# Patient Record
Sex: Male | Born: 1935 | Race: White | Hispanic: No | Marital: Married | Smoking: Never smoker
Health system: Southern US, Community
[De-identification: ages and names within clinical notes are randomized; demographics above are authoritative.]

## PROBLEM LIST (undated history)

## (undated) DIAGNOSIS — G894 Chronic pain syndrome: Secondary | ICD-10-CM

## (undated) DIAGNOSIS — K5790 Diverticulosis of intestine, part unspecified, without perforation or abscess without bleeding: Secondary | ICD-10-CM

## (undated) DIAGNOSIS — N312 Flaccid neuropathic bladder, not elsewhere classified: Secondary | ICD-10-CM

## (undated) DIAGNOSIS — F0390 Unspecified dementia without behavioral disturbance: Secondary | ICD-10-CM

## (undated) DIAGNOSIS — F32A Depression, unspecified: Secondary | ICD-10-CM

## (undated) DIAGNOSIS — K635 Polyp of colon: Secondary | ICD-10-CM

## (undated) DIAGNOSIS — C61 Malignant neoplasm of prostate: Secondary | ICD-10-CM

## (undated) DIAGNOSIS — I10 Essential (primary) hypertension: Secondary | ICD-10-CM

## (undated) DIAGNOSIS — N400 Enlarged prostate without lower urinary tract symptoms: Secondary | ICD-10-CM

## (undated) DIAGNOSIS — J449 Chronic obstructive pulmonary disease, unspecified: Secondary | ICD-10-CM

## (undated) DIAGNOSIS — I2510 Atherosclerotic heart disease of native coronary artery without angina pectoris: Secondary | ICD-10-CM

## (undated) DIAGNOSIS — E785 Hyperlipidemia, unspecified: Secondary | ICD-10-CM

## (undated) DIAGNOSIS — I4891 Unspecified atrial fibrillation: Secondary | ICD-10-CM

## (undated) HISTORY — PX: BACK SURGERY: SHX140

## (undated) HISTORY — DX: Chronic pain syndrome: G89.4

## (undated) HISTORY — DX: Chronic obstructive pulmonary disease, unspecified: J44.9

## (undated) HISTORY — DX: Malignant neoplasm of prostate: C61

## (undated) HISTORY — DX: Polyp of colon: K63.5

## (undated) HISTORY — DX: Hyperlipidemia, unspecified: E78.5

## (undated) HISTORY — PX: EYE SURGERY: SHX253

## (undated) HISTORY — DX: Flaccid neuropathic bladder, not elsewhere classified: N31.2

## (undated) HISTORY — PX: CARDIAC SURGERY: SHX584

## (undated) HISTORY — DX: Benign prostatic hyperplasia without lower urinary tract symptoms: N40.0

## (undated) HISTORY — PX: TRANSURETHRAL RESECTION OF PROSTATE: SHX73

## (undated) HISTORY — DX: Unspecified dementia, unspecified severity, without behavioral disturbance, psychotic disturbance, mood disturbance, and anxiety: F03.90

## (undated) HISTORY — DX: Depression, unspecified: F32.A

## (undated) HISTORY — PX: CORONARY ARTERY BYPASS GRAFT: SHX141

## (undated) HISTORY — DX: Diverticulosis of intestine, part unspecified, without perforation or abscess without bleeding: K57.90

## (undated) HISTORY — DX: Unspecified atrial fibrillation: I48.91

---

## 2002-08-23 ENCOUNTER — Encounter: Admission: RE | Admit: 2002-08-23 | Discharge: 2002-08-23 | Payer: Self-pay | Admitting: Urology

## 2002-08-23 ENCOUNTER — Encounter: Payer: Self-pay | Admitting: Urology

## 2002-08-25 ENCOUNTER — Ambulatory Visit (HOSPITAL_BASED_OUTPATIENT_CLINIC_OR_DEPARTMENT_OTHER): Admission: RE | Admit: 2002-08-25 | Discharge: 2002-08-25 | Payer: Self-pay | Admitting: Urology

## 2002-09-02 ENCOUNTER — Ambulatory Visit: Admission: RE | Admit: 2002-09-02 | Discharge: 2002-09-27 | Payer: Self-pay | Admitting: Radiation Oncology

## 2002-09-07 ENCOUNTER — Encounter: Admission: RE | Admit: 2002-09-07 | Discharge: 2002-09-07 | Payer: Self-pay | Admitting: Urology

## 2002-09-07 ENCOUNTER — Encounter: Payer: Self-pay | Admitting: Urology

## 2002-09-09 ENCOUNTER — Encounter: Payer: Self-pay | Admitting: Urology

## 2002-09-09 ENCOUNTER — Encounter: Admission: RE | Admit: 2002-09-09 | Discharge: 2002-09-09 | Payer: Self-pay | Admitting: Urology

## 2002-10-13 ENCOUNTER — Ambulatory Visit: Admission: RE | Admit: 2002-10-13 | Discharge: 2003-01-11 | Payer: Self-pay | Admitting: Radiation Oncology

## 2003-01-06 ENCOUNTER — Ambulatory Visit (HOSPITAL_BASED_OUTPATIENT_CLINIC_OR_DEPARTMENT_OTHER): Admission: RE | Admit: 2003-01-06 | Discharge: 2003-01-06 | Payer: Self-pay | Admitting: Urology

## 2003-01-06 ENCOUNTER — Encounter: Payer: Self-pay | Admitting: Urology

## 2003-01-30 ENCOUNTER — Ambulatory Visit: Admission: RE | Admit: 2003-01-30 | Discharge: 2003-02-22 | Payer: Self-pay | Admitting: Radiation Oncology

## 2013-01-15 DIAGNOSIS — I10 Essential (primary) hypertension: Secondary | ICD-10-CM | POA: Insufficient documentation

## 2013-01-15 DIAGNOSIS — I2510 Atherosclerotic heart disease of native coronary artery without angina pectoris: Secondary | ICD-10-CM | POA: Insufficient documentation

## 2013-01-15 DIAGNOSIS — J45909 Unspecified asthma, uncomplicated: Secondary | ICD-10-CM | POA: Insufficient documentation

## 2015-08-22 DIAGNOSIS — E611 Iron deficiency: Secondary | ICD-10-CM | POA: Insufficient documentation

## 2015-08-22 DIAGNOSIS — K5730 Diverticulosis of large intestine without perforation or abscess without bleeding: Secondary | ICD-10-CM | POA: Insufficient documentation

## 2015-08-22 DIAGNOSIS — K219 Gastro-esophageal reflux disease without esophagitis: Secondary | ICD-10-CM | POA: Insufficient documentation

## 2015-08-22 DIAGNOSIS — R413 Other amnesia: Secondary | ICD-10-CM | POA: Insufficient documentation

## 2016-06-02 DIAGNOSIS — D649 Anemia, unspecified: Secondary | ICD-10-CM | POA: Insufficient documentation

## 2016-06-10 DIAGNOSIS — Z8546 Personal history of malignant neoplasm of prostate: Secondary | ICD-10-CM | POA: Insufficient documentation

## 2016-06-17 DIAGNOSIS — C61 Malignant neoplasm of prostate: Secondary | ICD-10-CM | POA: Insufficient documentation

## 2016-12-30 ENCOUNTER — Emergency Department (HOSPITAL_BASED_OUTPATIENT_CLINIC_OR_DEPARTMENT_OTHER)
Admission: EM | Admit: 2016-12-30 | Discharge: 2016-12-30 | Disposition: A | Discharge status: Home / Self Care | Payer: Medicare Other | Attending: Emergency Medicine | Admitting: Emergency Medicine

## 2016-12-30 ENCOUNTER — Encounter (HOSPITAL_BASED_OUTPATIENT_CLINIC_OR_DEPARTMENT_OTHER): Payer: Self-pay | Admitting: Emergency Medicine

## 2016-12-30 ENCOUNTER — Emergency Department (HOSPITAL_BASED_OUTPATIENT_CLINIC_OR_DEPARTMENT_OTHER): Payer: Medicare Other

## 2016-12-30 DIAGNOSIS — Z7901 Long term (current) use of anticoagulants: Secondary | ICD-10-CM | POA: Insufficient documentation

## 2016-12-30 DIAGNOSIS — S8012XA Contusion of left lower leg, initial encounter: Secondary | ICD-10-CM | POA: Diagnosis not present

## 2016-12-30 DIAGNOSIS — S8992XA Unspecified injury of left lower leg, initial encounter: Secondary | ICD-10-CM | POA: Diagnosis present

## 2016-12-30 DIAGNOSIS — Y929 Unspecified place or not applicable: Secondary | ICD-10-CM | POA: Diagnosis not present

## 2016-12-30 DIAGNOSIS — Y999 Unspecified external cause status: Secondary | ICD-10-CM | POA: Diagnosis not present

## 2016-12-30 DIAGNOSIS — I2510 Atherosclerotic heart disease of native coronary artery without angina pectoris: Secondary | ICD-10-CM | POA: Insufficient documentation

## 2016-12-30 DIAGNOSIS — Z7982 Long term (current) use of aspirin: Secondary | ICD-10-CM | POA: Insufficient documentation

## 2016-12-30 DIAGNOSIS — X58XXXA Exposure to other specified factors, initial encounter: Secondary | ICD-10-CM | POA: Diagnosis not present

## 2016-12-30 DIAGNOSIS — Y939 Activity, unspecified: Secondary | ICD-10-CM | POA: Insufficient documentation

## 2016-12-30 DIAGNOSIS — Z79899 Other long term (current) drug therapy: Secondary | ICD-10-CM | POA: Diagnosis not present

## 2016-12-30 DIAGNOSIS — R791 Abnormal coagulation profile: Secondary | ICD-10-CM | POA: Diagnosis not present

## 2016-12-30 DIAGNOSIS — I10 Essential (primary) hypertension: Secondary | ICD-10-CM | POA: Insufficient documentation

## 2016-12-30 HISTORY — DX: Essential (primary) hypertension: I10

## 2016-12-30 HISTORY — DX: Atherosclerotic heart disease of native coronary artery without angina pectoris: I25.10

## 2016-12-30 LAB — CBC WITH DIFFERENTIAL/PLATELET
BASOS ABS: 0 10*3/uL (ref 0.0–0.1)
BASOS PCT: 0 %
EOS ABS: 0 10*3/uL (ref 0.0–0.7)
Eosinophils Relative: 0 %
HCT: 35 % — ABNORMAL LOW (ref 39.0–52.0)
Hemoglobin: 12.1 g/dL — ABNORMAL LOW (ref 13.0–17.0)
LYMPHS ABS: 0.5 10*3/uL — AB (ref 0.7–4.0)
Lymphocytes Relative: 3 %
MCH: 29.8 pg (ref 26.0–34.0)
MCHC: 34.6 g/dL (ref 30.0–36.0)
MCV: 86.2 fL (ref 78.0–100.0)
Monocytes Absolute: 1.2 10*3/uL — ABNORMAL HIGH (ref 0.1–1.0)
Monocytes Relative: 8 %
Neutro Abs: 13.6 10*3/uL — ABNORMAL HIGH (ref 1.7–7.7)
Neutrophils Relative %: 89 %
Platelets: 263 10*3/uL (ref 150–400)
RBC: 4.06 MIL/uL — AB (ref 4.22–5.81)
RDW: 15.8 % — ABNORMAL HIGH (ref 11.5–15.5)
WBC: 15.3 10*3/uL — AB (ref 4.0–10.5)

## 2016-12-30 LAB — COMPREHENSIVE METABOLIC PANEL
ALBUMIN: 3.9 g/dL (ref 3.5–5.0)
ALK PHOS: 93 U/L (ref 38–126)
ALT: 30 U/L (ref 17–63)
AST: 30 U/L (ref 15–41)
Anion gap: 9 (ref 5–15)
BUN: 25 mg/dL — ABNORMAL HIGH (ref 6–20)
CALCIUM: 9.3 mg/dL (ref 8.9–10.3)
CHLORIDE: 96 mmol/L — AB (ref 101–111)
CO2: 27 mmol/L (ref 22–32)
Creatinine, Ser: 0.62 mg/dL (ref 0.61–1.24)
GFR calc Af Amer: >60 mL/min (ref >60)
GFR calc non Af Amer: >60 mL/min (ref >60)
GLUCOSE: 117 mg/dL — AB (ref 65–99)
Potassium: 3.7 mmol/L (ref 3.5–5.1)
SODIUM: 132 mmol/L — AB (ref 135–145)
Total Bilirubin: 1 mg/dL (ref 0.3–1.2)
Total Protein: 7.5 g/dL (ref 6.5–8.1)

## 2016-12-30 LAB — PROTIME-INR
INR: 4.34
Prothrombin Time: 42.7 seconds — ABNORMAL HIGH (ref 11.4–15.2)

## 2016-12-30 MED ORDER — PHYTONADIONE 5 MG PO TABS
5.0000 mg | ORAL_TABLET | Freq: Once | ORAL | Status: AC
  Administered 2016-12-30: 5 mg via ORAL
  Filled 2016-12-30: qty 1

## 2016-12-30 NOTE — ED Notes (Signed)
Pt verbalized understanding of discharge instructions and denies any further questions at this time.   

## 2016-12-30 NOTE — ED Notes (Signed)
ED Provider at bedside. 

## 2016-12-30 NOTE — ED Triage Notes (Addendum)
L leg pain for several days. Pt noticed large bruise to upper thigh. Pt has large bruise to to entire upper thigh. No definitive injury. Pt takes coumadin.  Pt had INR checked yesterday and found to be high.

## 2016-12-30 NOTE — ED Notes (Signed)
md notified of INR of 4.34

## 2016-12-30 NOTE — Discharge Instructions (Signed)
Hold coumadin for 2 days.   Repeat INR in 2 days with your doctor.   Expect the black and blue to travel down his leg.   See your doctor  Return to ER if he has more bleeding, blood in stool, vomiting blood.

## 2016-12-30 NOTE — ED Provider Notes (Signed)
[facility] Provider Note   CSN: [id number] Arrival date & time: 12/30/16  1744  By signing my name below, I, Mayer Masker, attest that this documentation has been prepared under the direction and in the presence of Drenda Freeze, MD. Electronically Signed: Mayer Masker, Scribe. 12/30/16. 7:02 PM.  History   Chief Complaint Chief Complaint  Patient presents with  . Leg Pain   The history is provided by the patient. No language interpreter was used.   HPI Comments: Alexander Friedman is a 83 y.o. male who presents to the Emergency Department complaining of constant, left-sided leg pain that began yesterday. He states there is pain in the crease of his leg and when he walks, the pain worsens. Pt was told yesterday not to take coumadin because his INR was 4.9 yesterday. He denies abdominal pain, blood in stool, blood in vomit, or any injury or trauma to the area. Pt has suprapubic catheter in place. Also had cortisone injection L knee yesterday.    Past Medical History:  Diagnosis Date  . Coronary artery disease   . Hypertension     There are no active problems to display for this patient.   Past Surgical History:  Procedure Laterality Date  . BACK SURGERY    . CARDIAC SURGERY    . EYE SURGERY         Home Medications    Prior to Admission medications   Medication Sig Start Date End Date Taking? Authorizing Provider  aspirin 81 MG chewable tablet Chew by mouth daily.   Yes [provider]  atorvastatin (LIPITOR) 20 MG tablet Take 20 mg by mouth daily.   Yes [provider]  Calcium Carb-Cholecalciferol (CALCIUM-VITAMIN D) 500-200 MG-UNIT tablet Take 1 tablet by mouth daily.   Yes [provider]  chlorthalidone (HYGROTON) 25 MG tablet Take 25 mg by mouth 2 (two) times daily.   Yes [provider]  clotrimazole-betamethasone (LOTRISONE) cream Apply 1 application topically 2 (two) times daily as needed.   Yes [provider]  cyanocobalamin (,VITAMIN B-12,) 1000 MCG/ML injection Inject 1,000 mcg into the muscle See admin instructions. Every 2 months   Yes [provider]  ferrous sulfate 325 (65 FE) MG tablet Take 325 mg by mouth daily with breakfast.   Yes [provider]  FIBER PO Take by mouth 2 (two) times daily.   Yes [provider]  Fluticasone-Salmeterol (AIRDUO RESPICLICK 69/67 IN) Inhale into the lungs daily.   Yes [provider]  HYDROmorphone HCl (EXALGO) 12 MG T24A SR tablet Take 12 mg by mouth daily.   Yes [provider]  meloxicam (MOBIC) 7.5 MG tablet Take 7.5 mg by mouth daily.   Yes [provider]  montelukast (SINGULAIR) 10 MG tablet Take 10 mg by mouth at bedtime.   Yes [provider]  nitroGLYCERIN (NITROSTAT) 0.4 MG SL tablet Place 0.4 mg under the tongue every 5 (five) minutes as needed for chest pain.   Yes [provider]  omeprazole (PRILOSEC) 40 MG capsule Take 40 mg by mouth daily.   Yes [provider]  oxymorphone (OPANA) 5 MG tablet Take 5 mg by mouth every 8 (eight) hours as needed for pain.   Yes [provider]  polyethylene glycol (MIRALAX / GLYCOLAX) packet Take 17 g by mouth daily as needed.   Yes [provider]  potassium chloride SA (K-DUR,KLOR-CON) 20 MEQ tablet Take 20 mEq by mouth See admin instructions. Unknown  how often   Yes [provider]  pregabalin (LYRICA) 75 MG capsule Take 75 mg by mouth 2 (two) times daily.   Yes [provider]  Probiotic Product (4X PROBIOTIC PO) Take by mouth daily.   Yes [provider]  sertraline (ZOLOFT) 100 MG tablet Take 100 mg by mouth daily.   Yes [provider]  solifenacin (VESICARE) 10 MG tablet Take by mouth daily.   Yes [provider]  telmisartan (MICARDIS) 80 MG tablet Take 80 mg by mouth daily.   Yes [provider]  valACYclovir (VALTREX) 1000 MG tablet Take  1,000 mg by mouth daily as needed.   Yes [provider]  warfarin (COUMADIN) 1 MG tablet Take 4.5 mg by mouth See admin instructions. Take 4.5mg  every Tuesday, Thursday and Saturday   Yes [provider]  warfarin (COUMADIN) 3 MG tablet Take 3 mg by mouth See admin instructions. Take 1 tab every Monday, Wednesday, Friday and Sunday   Yes [provider]  zolpidem (AMBIEN) 5 MG tablet Take 5 mg by mouth at bedtime as needed for sleep.   Yes [provider]    Family History No family history on file.  Social History Social History  Substance Use Topics  . Smoking status: Never Smoker  . Smokeless tobacco: Never Used  . Alcohol use No     Allergies   Patient has no known allergies.   Review of Systems Review of Systems  Gastrointestinal: Negative for abdominal pain, blood in stool and vomiting (no blood in vomit).  Musculoskeletal: Positive for myalgias.  Skin: Positive for color change.  All other systems reviewed and are negative.    Physical Exam Updated Vital Signs BP (!) 145/92 (BP Location: Left Arm)   Pulse 78   Temp 98.6 F (37 C) (Oral)   Resp 16   SpO2 98%   Physical Exam  Constitutional: He is oriented to person, place, and time. He appears well-developed and well-nourished.  HENT:  Head: Normocephalic and atraumatic.  Cardiovascular: Normal rate and regular rhythm.   Pulmonary/Chest: Effort normal.  Musculoskeletal: Normal range of motion.  No femoral hernia Ecchymosis of left inner thigh to popliteal area Suprapubic catheter in place No signs of infection around catheter Left knee lateral aspect with injection site; no evidence of cellulitis Normal ROM of knee  Neurological: He is alert and oriented to person, place, and time.  neurovascular intact in lower extremity   Skin: Skin is warm and dry.  Psychiatric: He has a normal mood and affect.  Nursing note and vitals reviewed.    ED Treatments / Results    DIAGNOSTIC STUDIES: Oxygen Saturation is 98% on RA, normal by my interpretation.    COORDINATION OF CARE: 7:22 PM Discussed treatment plan with pt at bedside and pt agreed to plan. Labs (all labs ordered are listed, but only abnormal results are displayed) Labs Reviewed  CBC WITH DIFFERENTIAL/PLATELET  COMPREHENSIVE METABOLIC PANEL  PROTIME-INR    EKG  EKG Interpretation None       Radiology Dg Femur Min 2 Views Left  Result Date: 12/30/2016 CLINICAL DATA:  Left femur pain with bruising EXAM: LEFT FEMUR 2 VIEWS COMPARISON:  None. FINDINGS: Vascular calcifications. Moderate-to-marked degenerative changes at the left knee. No acute fracture or malalignment. Chondrocalcinosis at the knee joints. Prostatic seeds. IMPRESSION: 1. No acute osseous abnormality 2. Moderate-to-marked degenerative changes at the left knee with chondrocalcinosis Electronically Signed   By: Madie Reno.D.  On: 12/30/2016 18:53    Procedures Procedures (including critical care time)  Medications Ordered in ED Medications - No data to display   Initial Impression / Assessment and Plan / ED Course  I have reviewed the triage vital signs and the nursing notes.  Pertinent labs & imaging results that were available during my care of the patient were reviewed by me and considered in my medical decision making (see chart for details).     Alexander Friedman is a 83 y.o. male here with ecchymosis L inner thigh. He has no trauma, moving hips well. No signs of inguinal hernia. xrays showed no fracture. Hg 12, stable. INR 4.3. Denies blood in stool or vomiting blood. I think ecchymosis likely from some spontaneous bleeding with supratherapeutic INR. Given vit K 5 mg in the ED. Will have him hold coumadin for repeat INR in 2 days.    Final Clinical Impressions(s) / ED Diagnoses   Final diagnoses:  None    New Prescriptions New Prescriptions   No medications on file  I personally performed the services  described in this documentation, which was scribed in my presence. The recorded information has been reviewed and is accurate.    Drenda Freeze, MD 12/30/16 2036

## 2017-03-27 DIAGNOSIS — R269 Unspecified abnormalities of gait and mobility: Secondary | ICD-10-CM | POA: Insufficient documentation

## 2017-03-27 DIAGNOSIS — H9190 Unspecified hearing loss, unspecified ear: Secondary | ICD-10-CM | POA: Insufficient documentation

## 2017-07-10 DIAGNOSIS — N312 Flaccid neuropathic bladder, not elsewhere classified: Secondary | ICD-10-CM | POA: Insufficient documentation

## 2017-07-29 ENCOUNTER — Emergency Department (HOSPITAL_BASED_OUTPATIENT_CLINIC_OR_DEPARTMENT_OTHER): Payer: Medicare Other

## 2017-07-29 ENCOUNTER — Emergency Department (HOSPITAL_BASED_OUTPATIENT_CLINIC_OR_DEPARTMENT_OTHER)
Admission: EM | Admit: 2017-07-29 | Discharge: 2017-07-29 | Disposition: A | Discharge status: Home / Self Care | Payer: Medicare Other | Attending: Emergency Medicine | Admitting: Emergency Medicine

## 2017-07-29 ENCOUNTER — Encounter (HOSPITAL_BASED_OUTPATIENT_CLINIC_OR_DEPARTMENT_OTHER): Payer: Self-pay | Admitting: Emergency Medicine

## 2017-07-29 ENCOUNTER — Other Ambulatory Visit: Payer: Self-pay

## 2017-07-29 DIAGNOSIS — Z7982 Long term (current) use of aspirin: Secondary | ICD-10-CM | POA: Insufficient documentation

## 2017-07-29 DIAGNOSIS — W19XXXA Unspecified fall, initial encounter: Secondary | ICD-10-CM

## 2017-07-29 DIAGNOSIS — N390 Urinary tract infection, site not specified: Secondary | ICD-10-CM | POA: Insufficient documentation

## 2017-07-29 DIAGNOSIS — I10 Essential (primary) hypertension: Secondary | ICD-10-CM | POA: Insufficient documentation

## 2017-07-29 DIAGNOSIS — Y9389 Activity, other specified: Secondary | ICD-10-CM | POA: Insufficient documentation

## 2017-07-29 DIAGNOSIS — Z79899 Other long term (current) drug therapy: Secondary | ICD-10-CM | POA: Diagnosis not present

## 2017-07-29 DIAGNOSIS — Z7901 Long term (current) use of anticoagulants: Secondary | ICD-10-CM | POA: Diagnosis not present

## 2017-07-29 DIAGNOSIS — E876 Hypokalemia: Secondary | ICD-10-CM | POA: Diagnosis not present

## 2017-07-29 DIAGNOSIS — W050XXA Fall from non-moving wheelchair, initial encounter: Secondary | ICD-10-CM | POA: Insufficient documentation

## 2017-07-29 DIAGNOSIS — Y999 Unspecified external cause status: Secondary | ICD-10-CM | POA: Diagnosis not present

## 2017-07-29 DIAGNOSIS — I259 Chronic ischemic heart disease, unspecified: Secondary | ICD-10-CM | POA: Insufficient documentation

## 2017-07-29 DIAGNOSIS — M549 Dorsalgia, unspecified: Secondary | ICD-10-CM | POA: Diagnosis not present

## 2017-07-29 DIAGNOSIS — Y92013 Bedroom of single-family (private) house as the place of occurrence of the external cause: Secondary | ICD-10-CM | POA: Insufficient documentation

## 2017-07-29 DIAGNOSIS — R791 Abnormal coagulation profile: Secondary | ICD-10-CM

## 2017-07-29 DIAGNOSIS — S199XXA Unspecified injury of neck, initial encounter: Secondary | ICD-10-CM | POA: Diagnosis present

## 2017-07-29 LAB — COMPREHENSIVE METABOLIC PANEL
ALK PHOS: 96 U/L (ref 38–126)
ALT: 33 U/L (ref 17–63)
AST: 28 U/L (ref 15–41)
Albumin: 3.3 g/dL — ABNORMAL LOW (ref 3.5–5.0)
Anion gap: 8 (ref 5–15)
BILIRUBIN TOTAL: 0.5 mg/dL (ref 0.3–1.2)
BUN: 24 mg/dL — AB (ref 6–20)
CALCIUM: 9 mg/dL (ref 8.9–10.3)
CHLORIDE: 99 mmol/L — AB (ref 101–111)
CO2: 28 mmol/L (ref 22–32)
CREATININE: 0.85 mg/dL (ref 0.61–1.24)
GFR calc Af Amer: >60 mL/min (ref >60)
Glucose, Bld: 123 mg/dL — ABNORMAL HIGH (ref 65–99)
Potassium: 2.9 mmol/L — ABNORMAL LOW (ref 3.5–5.1)
Sodium: 135 mmol/L (ref 135–145)
Total Protein: 7.1 g/dL (ref 6.5–8.1)

## 2017-07-29 LAB — CBC WITH DIFFERENTIAL/PLATELET
BASOS ABS: 0 10*3/uL (ref 0.0–0.1)
Basophils Relative: 0 %
EOS PCT: 0 %
Eosinophils Absolute: 0.1 10*3/uL (ref 0.0–0.7)
HCT: 41.2 % (ref 39.0–52.0)
Hemoglobin: 14 g/dL (ref 13.0–17.0)
LYMPHS PCT: 4 %
Lymphs Abs: 0.6 10*3/uL — ABNORMAL LOW (ref 0.7–4.0)
MCH: 28.7 pg (ref 26.0–34.0)
MCHC: 34 g/dL (ref 30.0–36.0)
MCV: 84.4 fL (ref 78.0–100.0)
MONO ABS: 0.7 10*3/uL (ref 0.1–1.0)
Monocytes Relative: 5 %
Neutro Abs: 12.4 10*3/uL — ABNORMAL HIGH (ref 1.7–7.7)
Neutrophils Relative %: 91 %
PLATELETS: 255 10*3/uL (ref 150–400)
RBC: 4.88 MIL/uL (ref 4.22–5.81)
RDW: 15.9 % — AB (ref 11.5–15.5)
WBC: 13.8 10*3/uL — ABNORMAL HIGH (ref 4.0–10.5)

## 2017-07-29 LAB — URINALYSIS, MICROSCOPIC (REFLEX): Squamous Epithelial / LPF: NONE SEEN

## 2017-07-29 LAB — URINALYSIS, ROUTINE W REFLEX MICROSCOPIC
Bilirubin Urine: NEGATIVE
GLUCOSE, UA: NEGATIVE mg/dL
KETONES UR: NEGATIVE mg/dL
Nitrite: POSITIVE — AB
PROTEIN: 30 mg/dL — AB
Specific Gravity, Urine: 1.02 (ref 1.005–1.030)
pH: 7.5 (ref 5.0–8.0)

## 2017-07-29 LAB — PROTIME-INR: Prothrombin Time: >90 seconds — ABNORMAL HIGH (ref 11.4–15.2)

## 2017-07-29 MED ORDER — CEPHALEXIN 500 MG PO CAPS: 500.0000 mg | ORAL_CAPSULE | Freq: Two times a day (BID) | ORAL | 0 refills | Status: AC

## 2017-07-29 MED ORDER — PHYTONADIONE 5 MG PO TABS
5.0000 mg | ORAL_TABLET | Freq: Once | ORAL | Status: AC
  Administered 2017-07-29: 5 mg via ORAL
  Filled 2017-07-29: qty 1

## 2017-07-29 MED ORDER — DEXTROSE 5 % IV SOLN
1.0000 g | Freq: Once | INTRAVENOUS | Status: AC
  Administered 2017-07-29: 1 g via INTRAVENOUS
  Filled 2017-07-29: qty 10

## 2017-07-29 MED ORDER — MORPHINE SULFATE (PF) 4 MG/ML IV SOLN
4.0000 mg | Freq: Once | INTRAVENOUS | Status: AC
  Administered 2017-07-29: 4 mg via INTRAVENOUS
  Filled 2017-07-29: qty 1

## 2017-07-29 MED ORDER — POTASSIUM CHLORIDE CRYS ER 20 MEQ PO TBCR: 40.0000 meq | EXTENDED_RELEASE_TABLET | Freq: Every day | ORAL | 0 refills | Status: AC

## 2017-07-29 MED ORDER — POTASSIUM CHLORIDE CRYS ER 20 MEQ PO TBCR
40.0000 meq | EXTENDED_RELEASE_TABLET | Freq: Once | ORAL | Status: DC
  Filled 2017-07-29: qty 2

## 2017-07-29 MED ORDER — POTASSIUM CHLORIDE 20 MEQ PO PACK
40.0000 meq | PACK | Freq: Two times a day (BID) | ORAL | Status: DC
  Filled 2017-07-29: qty 2

## 2017-07-29 MED ORDER — POTASSIUM CHLORIDE 20 MEQ/15ML (10%) PO SOLN
ORAL | Status: AC
  Administered 2017-07-29: 40 meq
  Filled 2017-07-29: qty 30

## 2017-07-29 NOTE — ED Notes (Signed)
Family at bedside. 

## 2017-07-29 NOTE — ED Notes (Signed)
Patient transported to CT 

## 2017-07-29 NOTE — ED Provider Notes (Signed)
[facility] EMERGENCY DEPARTMENT Provider Note   CSN: [id number] Arrival date & time: 07/29/17  1133     History   Chief Complaint Chief Complaint  Patient presents with  . Fall    HPI Alexander Friedman is a 83 y.o. male.  HPI Patient with history of chronic back pain states that he was unable to get out of bed this morning.  Attempted to transfer to his electrical wheelchair and had a fall to the floor.  Found the floor by his daughter.  Denied any head injury.  No loss of consciousness.  Patient states he is having worsening of his low back pain.  Denies any neck pain.  Denies focal weakness or numbness.   Past Medical History:  Diagnosis Date  . Coronary artery disease   . Hypertension     There are no active problems to display for this patient.   Past Surgical History:  Procedure Laterality Date  . BACK SURGERY    . [place]  . EYE SURGERY         Home Medications    Prior to Admission medications   Medication Sig Start Date End Date Taking? Authorizing Provider  aspirin 81 MG chewable tablet Chew by mouth daily.    [provider]  atorvastatin (LIPITOR) 20 MG tablet Take 20 mg by mouth daily.    [provider]  Calcium Carb-Cholecalciferol (CALCIUM-VITAMIN D) 500-200 MG-UNIT tablet Take 1 tablet by mouth daily.    [provider]  cephALEXin (KEFLEX) 500 MG capsule Take 1 capsule (500 mg total) by mouth 2 (two) times daily. 07/29/17   Julianne Rice, MD  chlorthalidone (HYGROTON) 25 MG tablet Take 25 mg by mouth 2 (two) times daily.    [provider]  clotrimazole-betamethasone (LOTRISONE) cream Apply 1 application topically 2 (two) times daily as needed.    [provider]  cyanocobalamin (,VITAMIN B-12,) 1000 MCG/ML injection Inject 1,000 mcg into the muscle See admin instructions. Every 2 months    [provider]  ferrous sulfate 325 (65 FE) MG tablet Take 325 mg by  mouth daily with breakfast.    [provider]  FIBER PO Take by mouth 2 (two) times daily.    [provider]  Fluticasone-Salmeterol (AIRDUO RESPICLICK 31/51 IN) Inhale into the lungs daily.    [provider]  HYDROmorphone HCl (EXALGO) 12 MG T24A SR tablet Take 12 mg by mouth daily.    [provider]  meloxicam (MOBIC) 7.5 MG tablet Take 7.5 mg by mouth daily.    [provider]  montelukast (SINGULAIR) 10 MG tablet Take 10 mg by mouth at bedtime.    [provider]  nitroGLYCERIN (NITROSTAT) 0.4 MG SL tablet Place 0.4 mg under the tongue every 5 (five) minutes as needed for chest pain.    [provider]  omeprazole (PRILOSEC) 40 MG capsule Take 40 mg by mouth daily.    [provider]  oxymorphone (OPANA) 5 MG tablet Take 5 mg by mouth every 8 (eight) hours as needed for pain.    [provider]  polyethylene glycol (MIRALAX / GLYCOLAX) packet Take 17 g by mouth daily as needed.    [provider]  potassium chloride SA (K-DUR,KLOR-CON) 20 MEQ tablet Take 2 tablets (40 mEq total) by mouth daily. X 3 day 07/29/17   Julianne Rice, MD  pregabalin (LYRICA) 75 MG capsule Take 75 mg by mouth 2 (  two) times daily.    [provider]  Probiotic Product (4X PROBIOTIC PO) Take by mouth daily.    [provider]  sertraline (ZOLOFT) 100 MG tablet Take 100 mg by mouth daily.    [provider]  solifenacin (VESICARE) 10 MG tablet Take by mouth daily.    [provider]  telmisartan (MICARDIS) 80 MG tablet Take 80 mg by mouth daily.    [provider]  valACYclovir (VALTREX) 1000 MG tablet Take 1,000 mg by mouth daily as needed.    [provider]  warfarin (COUMADIN) 1 MG tablet Take 4.5 mg by mouth See admin instructions. Take 4.5mg  every Tuesday, Thursday and Saturday    [provider]  warfarin (COUMADIN) 3 MG tablet Take 3 mg by mouth See admin  instructions. Take 1 tab every Monday, Wednesday, Friday and Sunday    [provider]  zolpidem (AMBIEN) 5 MG tablet Take 5 mg by mouth at bedtime as needed for sleep.    [provider]    Family History No family history on file.  Social History Social History   Tobacco Use  . Smoking status: Never Smoker  . Smokeless tobacco: Never Used  Substance Use Topics  . Alcohol use: No  . Drug use: No     Allergies   Patient has no known allergies.   Review of Systems Review of Systems  Constitutional: Negative for chills and fever.  Eyes: Negative for visual disturbance.  Respiratory: Negative for shortness of breath.   Cardiovascular: Negative for chest pain.  Gastrointestinal: Negative for abdominal pain, diarrhea, nausea and vomiting.  Genitourinary: Negative for dysuria.  Musculoskeletal: Positive for back pain. Negative for myalgias and neck pain.  Skin: Negative for rash and wound.  Neurological: Negative for dizziness, syncope, light-headedness, numbness and headaches.     Physical Exam Updated Vital Signs BP 125/69   Pulse 68   Temp 97.9 F (36.6 C) (Oral)   Resp 18   Ht 5\' 11"  (1.803 m)   Wt 80.7 kg (178 lb)   SpO2 97%   BMI 24.83 kg/m   Physical Exam  Constitutional: He is oriented to person, place, and time. He appears well-developed and well-nourished. No distress.  HENT:  Head: Normocephalic and atraumatic.  Mouth/Throat: Oropharynx is clear and moist.  No evidence of scalp trauma.  Eyes: EOM are normal. Pupils are equal, round, and reactive to light.  Neck: Normal range of motion. Neck supple.  No posterior midline cervical tenderness to palpation.  Cardiovascular: Regular rhythm.  Irregularly irregular  Pulmonary/Chest: Effort normal and breath sounds normal.  Abdominal: Soft. Bowel sounds are normal. There is no tenderness. There is no rebound and no guarding.  Genitourinary:  Genitourinary Comments: Foley catheter in place  with leg bag.  Musculoskeletal: Normal range of motion. He exhibits no edema or tenderness.  Patient has inferior lumbar midline tenderness to palpation.  No step-offs or deformity.  Pelvis is stable.  Distal pulses intact.  Neurological: He is alert and oriented to person, place, and time.  4/5 motor in all extremities.  Sensation grossly intact.  Skin: Skin is warm and dry. Capillary refill takes less than 2 seconds. No rash noted. No erythema.  Psychiatric: He has a normal mood and affect. His behavior is normal.  Nursing note and vitals reviewed.    ED Treatments / Results  Labs (all labs ordered are listed, but only abnormal results are displayed) Labs Reviewed  CBC WITH DIFFERENTIAL/PLATELET -  Abnormal; Notable for the following components:      Result Value   WBC 13.8 (*)    RDW 15.9 (*)    Neutro Abs 12.4 (*)    Lymphs Abs 0.6 (*)    All other components within normal limits  URINALYSIS, ROUTINE W REFLEX MICROSCOPIC - Abnormal; Notable for the following components:   APPearance CLOUDY (*)    Hgb urine dipstick LARGE (*)    Protein, ur 30 (*)    Nitrite POSITIVE (*)    Leukocytes, UA LARGE (*)    All other components within normal limits  URINALYSIS, MICROSCOPIC (REFLEX) - Abnormal; Notable for the following components:   Bacteria, UA MANY (*)    All other components within normal limits  PROTIME-INR - Abnormal; Notable for the following components:   Prothrombin Time >90.0 (*)    INR >10.00 (*)    All other components within normal limits  COMPREHENSIVE METABOLIC PANEL - Abnormal; Notable for the following components:   Potassium 2.9 (*)    Chloride 99 (*)    Glucose, Bld 123 (*)    BUN 24 (*)    Albumin 3.3 (*)    All other components within normal limits  URINE CULTURE    EKG  EKG Interpretation None       Radiology Ct Head Wo Contrast  Result Date: 07/29/2017 CLINICAL DATA:  [place] from wheelchair.  Anti coagulated. EXAM: CT HEAD WITHOUT CONTRAST  TECHNIQUE: Contiguous axial images were obtained from the base of the skull through the vertex without intravenous contrast. COMPARISON:  03/23/2017 FINDINGS: Brain: Generalized atrophy. Chronic small-vessel ischemic changes of the white matter. Old right frontal infarction. No sign of acute infarction, mass lesion, hemorrhage, hydrocephalus or extra-axial collection. Vascular: There is atherosclerotic calcification of the major vessels at the base of the brain. Skull: Negative Sinuses/Orbits: Clear/normal Other: None IMPRESSION: No acute or traumatic finding. Atrophy. Old right frontal infarction. Chronic small-vessel change of the white matter. Electronically Signed   By: Nelson Chimes M.D.   On: 07/29/2017 12:31   Ct Cervical Spine Wo Contrast  Result Date: 07/29/2017 CLINICAL DATA:  [place] from wheelchair.  Neck pain. EXAM: CT CERVICAL SPINE WITHOUT CONTRAST TECHNIQUE: Multidetector CT imaging of the cervical spine was performed without intravenous contrast. Multiplanar CT image reconstructions were also generated. COMPARISON:  07/25/2016 FINDINGS: Alignment: Straightening of the normal lordosis. Mild curvature convex to the left. Skull base and vertebrae: No fracture. Soft tissues and spinal canal: Negative Disc levels:  C1-2: Ordinary osteoarthritis. C2-3:  Posterior element fusion.  No stenosis. C3-4:  Previous ACDF.  Solid fusion.  No stenosis. C4-5: Facet osteoarthritis with 2 mm anterolisthesis. Bilateral foraminal narrowing. C5-6: Spondylosis with endplate osteophytes. Facet osteoarthritis. Bilateral foraminal stenosis. C6-7: Spondylosis. Facet arthritis. Bilateral foraminal narrowing. C7-T1: Endplate osteophytes. Facet osteoarthritis. No compressive stenosis. Upper chest: Negative Other: Negative IMPRESSION: No acute or traumatic finding. Previous ACDF C3-4. Chronic bone fusion posteriorly at C2-3. Bilateral lateral recess stenosis at C4-5, C5-6 and C6-7 that could be symptomatic. Electronically Signed    By: Nelson Chimes M.D.   On: 07/29/2017 12:37   Ct Lumbar Spine Wo Contrast  Result Date: 07/29/2017 CLINICAL DATA:  [place] from wheelchair.  Back pain. EXAM: CT LUMBAR SPINE WITHOUT CONTRAST TECHNIQUE: Multidetector CT imaging of the lumbar spine was performed without intravenous contrast administration. Multiplanar CT image reconstructions were also generated. COMPARISON:  MRI 07/25/2016 FINDINGS: Segmentation: 5 lumbar type vertebral bodies. Alignment: Curvature convex to the right. Right were translation of  L3 relative to L4 of 1 cm which is chronic. Vertebrae: No acute fracture. Marked chronic degenerative changes as noted below. Paraspinal and other soft tissues: Aortic atherosclerosis. Branch vessel atherosclerosis. No acute finding. Disc levels: T11-12: Facet osteoarthritis.  No stenosis. T12-L1:  Facet osteoarthritis.  No stenosis. L1-2: Chronic loss of disc height. Vertebral body fusion. Sufficient patency of the canal and foramina. L2-3: Chronic disc degeneration with loss of disc height and vacuum phenomenon. Mild bulging of the disc. Facet arthropathy. Mild lateral recess and foraminal narrowing on the left. L3-4: Right were translation of 1 cm. Disc degeneration with chronic discogenic endplate changes. Facet hypertrophy. Moderate multifactorial stenosis at this level because of these findings. L4-5: Disc degeneration of vacuum phenomenon. Endplate osteophytes and bulging of the disc. Facet degeneration and hypertrophy worse on the right. Moderate canal and foraminal stenosis, right worse than left. L5-S1: Chronic disc degeneration with loss of disc height. Endplate osteophytes and bulging of the disc. Facet osteoarthritis. Mild bilateral foraminal stenosis. Bilateral sacroiliac osteoarthritis. IMPRESSION: No acute or traumatic finding. Curvature the spine convex to the right with advanced chronic degenerative changes. Multifactorial spinal stenosis at L3-4 and L4-5 that could be symptomatic. New  Electronically Signed   By: Nelson Chimes M.D.   On: 07/29/2017 12:35    Procedures Procedures (including critical care time)  Medications Ordered in ED Medications  potassium chloride (KLOR-CON) packet 40 mEq (not administered)  cefTRIAXone (ROCEPHIN) 1 g in dextrose 5 % 50 mL IVPB (0 g Intravenous Stopped 07/29/17 1450)  morphine 4 MG/ML injection 4 mg (4 mg Intravenous Given 07/29/17 1408)  potassium chloride 20 MEQ/15ML (10%) solution (40 mEq  Given 07/29/17 1442)  phytonadione (VITAMIN K) tablet 5 mg (5 mg Oral Given 07/29/17 1506)     Initial Impression / Assessment and Plan / ED Course  I have reviewed the triage vital signs and the nursing notes.  Pertinent labs & imaging results that were available during my care of the patient were reviewed by me and considered in my medical decision making (see chart for details).    No evidence of acute injury.  Patient is hemodynamically stable.  No evidence of hemorrhage.  Patient does have hypokalemia and is supratherapeutic on his INR.  Also likely has urinary tract infection.  Urine sent for culture.  Given dose of vitamin K and Rocephin.  Discussed with Lavella Hammock, NP.  Will have patient follow-up in his primary physician's office in 2 days to have INR and potassium rechecked.  Patient will also follow-up with his spinal surgeon regarding his degenerative spinal disease about possible intervention or steroid injection.  Have placed order for home health to come out to evaluate patient's home health needs.  Currently living with daughter and son-in-law.   Final Clinical Impressions(s) / ED Diagnoses   Final diagnoses:  Fall, initial encounter  Hypokalemia  Acute lower UTI  Supratherapeutic INR    ED Discharge Orders        Ordered    potassium chloride SA (K-DUR,KLOR-CON) 20 MEQ tablet  Daily     07/29/17 1535    cephALEXin (KEFLEX) 500 MG capsule  2 times daily     07/29/17 [place]     07/29/17 1535     Face-to-face encounter (required for Medicare/Medicaid patients)    Comments:  I Julianne Rice certify that this patient is under my care and that I, or a nurse practitioner or physician's assistant working with me, had a  face-to-face encounter that meets the physician face-to-face encounter requirements with this patient on 07/29/2017. The encounter with the patient was in whole, or in part for the following medical condition(s) which is the primary reason for home health care (List medical condition): Degenerative spinal disease   07/29/17 1535       Julianne Rice, MD 07/29/17 1538

## 2017-07-29 NOTE — ED Notes (Signed)
[facility] here for tranport, family at bedside

## 2017-07-29 NOTE — Discharge Instructions (Signed)
Hold your Coumadin and follow-up with your primary physician in 2 days to have your INR rechecked as well as your potassium.  Take antibiotics as prescribed.  Follow-up with your spinal surgeon.

## 2017-07-29 NOTE — ED Notes (Signed)
[facility] called for transport to home

## 2017-07-29 NOTE — ED Triage Notes (Signed)
Pt to ED via EMS; per EMS daughter called bc pt slipped out of w/c and was unable to get up w/o assistance; pt c/o lower back pain

## 2017-07-30 LAB — URINE CULTURE

## 2017-11-04 DIAGNOSIS — Z951 Presence of aortocoronary bypass graft: Secondary | ICD-10-CM | POA: Insufficient documentation

## 2018-02-22 ENCOUNTER — Other Ambulatory Visit: Payer: Self-pay

## 2018-02-22 ENCOUNTER — Encounter (HOSPITAL_BASED_OUTPATIENT_CLINIC_OR_DEPARTMENT_OTHER): Payer: Self-pay | Admitting: *Deleted

## 2018-02-22 ENCOUNTER — Emergency Department (HOSPITAL_BASED_OUTPATIENT_CLINIC_OR_DEPARTMENT_OTHER)
Admission: EM | Admit: 2018-02-22 | Discharge: 2018-02-22 | Disposition: A | Discharge status: Home / Self Care | Payer: Medicare Other | Attending: Emergency Medicine | Admitting: Emergency Medicine

## 2018-02-22 DIAGNOSIS — E876 Hypokalemia: Secondary | ICD-10-CM | POA: Diagnosis not present

## 2018-02-22 DIAGNOSIS — I10 Essential (primary) hypertension: Secondary | ICD-10-CM | POA: Diagnosis not present

## 2018-02-22 DIAGNOSIS — R197 Diarrhea, unspecified: Secondary | ICD-10-CM | POA: Insufficient documentation

## 2018-02-22 DIAGNOSIS — Z7982 Long term (current) use of aspirin: Secondary | ICD-10-CM | POA: Diagnosis not present

## 2018-02-22 DIAGNOSIS — I2510 Atherosclerotic heart disease of native coronary artery without angina pectoris: Secondary | ICD-10-CM | POA: Insufficient documentation

## 2018-02-22 DIAGNOSIS — Z79899 Other long term (current) drug therapy: Secondary | ICD-10-CM | POA: Insufficient documentation

## 2018-02-22 DIAGNOSIS — M549 Dorsalgia, unspecified: Secondary | ICD-10-CM | POA: Diagnosis present

## 2018-02-22 DIAGNOSIS — Z7901 Long term (current) use of anticoagulants: Secondary | ICD-10-CM | POA: Diagnosis not present

## 2018-02-22 LAB — CBC WITH DIFFERENTIAL/PLATELET
BASOS PCT: 0 %
Basophils Absolute: 0 10*3/uL (ref 0.0–0.1)
EOS ABS: 0.2 10*3/uL (ref 0.0–0.7)
EOS PCT: 2 %
HCT: 40 % (ref 39.0–52.0)
Hemoglobin: 13.7 g/dL (ref 13.0–17.0)
LYMPHS ABS: 0.9 10*3/uL (ref 0.7–4.0)
LYMPHS PCT: 12 %
MCH: 29.6 pg (ref 26.0–34.0)
MCHC: 34.3 g/dL (ref 30.0–36.0)
MCV: 86.4 fL (ref 78.0–100.0)
Monocytes Absolute: 0.7 10*3/uL (ref 0.1–1.0)
Monocytes Relative: 10 %
NEUTROS ABS: 5.6 10*3/uL (ref 1.7–7.7)
Neutrophils Relative %: 76 %
PLATELETS: 178 10*3/uL (ref 150–400)
RBC: 4.63 MIL/uL (ref 4.22–5.81)
RDW: 15.1 % (ref 11.5–15.5)
WBC: 7.4 10*3/uL (ref 4.0–10.5)

## 2018-02-22 LAB — COMPREHENSIVE METABOLIC PANEL
ALT: 22 U/L (ref 0–44)
ANION GAP: 10 (ref 5–15)
AST: 28 U/L (ref 15–41)
Albumin: 3.8 g/dL (ref 3.5–5.0)
Alkaline Phosphatase: 90 U/L (ref 38–126)
BUN: 20 mg/dL (ref 8–23)
CHLORIDE: 101 mmol/L (ref 98–111)
CO2: 28 mmol/L (ref 22–32)
CREATININE: 0.92 mg/dL (ref 0.61–1.24)
Calcium: 9 mg/dL (ref 8.9–10.3)
GFR calc non Af Amer: >60 mL/min (ref >60)
Glucose, Bld: 110 mg/dL — ABNORMAL HIGH (ref 70–99)
Potassium: 3 mmol/L — ABNORMAL LOW (ref 3.5–5.1)
SODIUM: 139 mmol/L (ref 135–145)
Total Bilirubin: 0.6 mg/dL (ref 0.3–1.2)
Total Protein: 6.8 g/dL (ref 6.5–8.1)

## 2018-02-22 LAB — LIPASE, BLOOD: Lipase: 24 U/L (ref 11–51)

## 2018-02-22 MED ORDER — SODIUM CHLORIDE 0.9 % IV SOLN
INTRAVENOUS | Status: DC
  Administered 2018-02-22: 19:00:00 via INTRAVENOUS

## 2018-02-22 MED ORDER — SODIUM CHLORIDE 0.9 % IV BOLUS
500.0000 mL | Freq: Once | INTRAVENOUS | Status: AC
  Administered 2018-02-22: 500 mL via INTRAVENOUS

## 2018-02-22 MED ORDER — POTASSIUM CHLORIDE CRYS ER 20 MEQ PO TBCR
40.0000 meq | EXTENDED_RELEASE_TABLET | Freq: Once | ORAL | Status: AC
  Administered 2018-02-22: 40 meq via ORAL
  Filled 2018-02-22: qty 2

## 2018-02-22 NOTE — ED Notes (Signed)
ED Provider at bedside. 

## 2018-02-22 NOTE — ED Triage Notes (Signed)
Chronic back pain. Here today for diarrhea for 5 days. Nausea. Hx of extreme constipation and extreme diarrhea.

## 2018-02-22 NOTE — ED Notes (Signed)
Pt resting with family at bedside  Pt is c/o chronic back pain with tingling in his legs  Denies any acute pain at this time  Pt states he is hungry  IVF of NS at 75 ml/hr, site WNL

## 2018-02-22 NOTE — Discharge Instructions (Addendum)
Restart your potassium daily that you have at home.  Labs here showed some mild low potassium at 3.0.  Return for any new or worse symptoms.  It is safe to use MiraLAX daily.  Make an appointment to follow up with your doctors.

## 2018-02-22 NOTE — ED Provider Notes (Signed)
[facility] EMERGENC[facility] DEPARTMENT Provider Note   CSN: [id number] Arrival date & time: 02/22/18  1420     History   Chief Complaint Chief Complaint  Patient presents with  . Back Pain  . Diarrhea    HPI Alexander Friedman is a 84 y.o. male.  Patient with a long-standing history of chronic back pain.  And also a history of intermittent episodes of diarrhea than followed by constipation.  Patient's had diarrhea for the past 5 days with nausea but no vomiting no blood in the bowel movements.  However through taking his medication for the diarrhea it stopped last night had a normal bowel movement today.  Patient is just concerned that he lost so much fluid and that his electrolytes may be off because that happened before.  Patient actually feeling better today.  No significant abdominal pain.  No fevers.  Patient does have a catheter in place with leg bag.  Patient has been with minimal ambulation now for a period of time secondary to the chronic back pain.     Past Medical History:  Diagnosis Date  . Coronary artery disease   . Hypertension     There are no active problems to display for this patient.   Past Surgical History:  Procedure Laterality Date  . BACK SURGERY    . [place]  . EYE SURGERY          Home Medications    Prior to Admission medications   Medication Sig Start Date End Date Taking? Authorizing Provider  aspirin 81 MG chewable tablet Chew by mouth daily.    [provider]  atorvastatin (LIPITOR) 20 MG tablet Take 20 mg by mouth daily.    [provider]  Calcium Carb-Cholecalciferol (CALCIUM-VITAMIN D) 500-200 MG-UNIT tablet Take 1 tablet by mouth daily.    [provider]  cephALEXin (KEFLEX) 500 MG capsule Take 1 capsule (500 mg total) by mouth 2 (two) times daily. 07/29/17   Julianne Rice, MD  chlorthalidone (HYGROTON) 25 MG tablet Take 25 mg by mouth 2 (two) times daily.    [provider]  clotrimazole-betamethasone (LOTRISONE) cream Apply 1 application topically 2 (two) times daily as needed.    [provider]  cyanocobalamin (,VITAMIN B-12,) 1000 MCG/ML injection Inject 1,000 mcg into the muscle See admin instructions. Every 2 months    [provider]  ferrous sulfate 325 (65 FE) MG tablet Take 325 mg by mouth daily with breakfast.    [provider]  FIBER PO Take by mouth 2 (two) times daily.    [provider]  Fluticasone-Salmeterol (AIRDUO RESPICLICK 74/25 IN) Inhale into the lungs daily.    [provider]  HYDROmorphone HCl (EXALGO) 12 MG T24A SR tablet Take 12 mg by mouth daily.    [provider]  meloxicam (MOBIC) 7.5 MG tablet Take 7.5 mg by mouth daily.    [provider]  montelukast (SINGULAIR) 10 MG tablet Take 10 mg by mouth at bedtime.    [provider]  nitroGLYCERIN (NITROSTAT) 0.4 MG SL tablet Place 0.4 mg under the tongue every 5 (five) minutes as needed for chest pain.    [provider]  omeprazole (PRILOSEC) 40 MG capsule Take 40 mg by mouth daily.    [provider]  oxymorphone (OPANA) 5 MG tablet Take 5 mg by mouth every 8 (eight) hours as needed for pain.    [provider]  polyethylene glycol (MIRALAX / GLYCOLAX) packet Take 17 g by mouth daily as needed.    [provider]  potassium chloride SA (K-DUR,KLOR-CON) 20 MEQ tablet Take 2 tablets (40 mEq total) by mouth daily. X 3 day 07/29/17   Julianne Rice, MD  pregabalin (LYRICA) 75 MG capsule Take 75 mg by mouth 2 (two) times daily.    [provider]  Probiotic Product (4X PROBIOTIC PO) Take by mouth daily.    [provider]  sertraline (ZOLOFT) 100 MG tablet Take 100 mg by mouth daily.    [provider]  solifenacin (VESICARE) 10 MG tablet Take by mouth daily.    [provider]  telmisartan (MICARDIS) 80 MG tablet Take 80 mg by mouth  daily.    [provider]  valACYclovir (VALTREX) 1000 MG tablet Take 1,000 mg by mouth daily as needed.    [provider]  warfarin (COUMADIN) 1 MG tablet Take 4.5 mg by mouth See admin instructions. Take 4.5mg  every Tuesday, Thursday and Saturday    [provider]  warfarin (COUMADIN) 3 MG tablet Take 3 mg by mouth See admin instructions. Take 1 tab every Monday, Wednesday, Friday and Sunday    [provider]  zolpidem (AMBIEN) 5 MG tablet Take 5 mg by mouth at bedtime as needed for sleep.    [provider]    Family History No family history on file.  Social History Social History   Tobacco Use  . Smoking status: Never Smoker  . Smokeless tobacco: Never Used  Substance Use Topics  . Alcohol use: No  . Drug use: No     Allergies   Patient has no known allergies.   Review of Systems Review of Systems  Constitutional: Negative for fever.  HENT: Negative for congestion.   Respiratory: Negative for shortness of breath.   Cardiovascular: Negative for chest pain.  Gastrointestinal: Negative for abdominal pain.  Genitourinary: Negative for dysuria.  Musculoskeletal: Positive for back pain.  Neurological: Positive for weakness. Negative for headaches.  Hematological: Does not bruise/bleed easily.  Psychiatric/Behavioral: Negative for confusion.     Physical Exam Updated Vital Signs BP (!) 170/95 (BP Location: Left Arm)   Pulse 70   Temp 98 F (36.7 C) (Oral)   Resp 18   Ht 1.803 m (5\' 11" )   Wt 81.6 kg (180 lb)   SpO2 94%   BMI 25.10 kg/m   Physical Exam  Constitutional: He is oriented to person, place, and time. He appears well-developed and well-nourished. No distress.  HENT:  Head: Normocephalic and atraumatic.  Mouth/Throat: Oropharynx is clear and moist.  Eyes: Pupils are equal, round, and reactive to light. EOM are normal.  Neck: Neck supple.  Cardiovascular: Normal rate, regular rhythm and normal heart  sounds.  Pulmonary/Chest: Breath sounds normal.  Abdominal: Soft. Bowel sounds are normal. There is no tenderness.  Genitourinary:  Genitourinary Comments: Catheter with leg bag.  Musculoskeletal: Normal range of motion.  Neurological: He is alert and oriented to person, place, and time. No cranial nerve deficit. He exhibits normal muscle tone. Coordination normal.  Skin: Skin is warm.  Nursing note and vitals reviewed.    ED Treatments / Results  Labs (all labs ordered are listed, but only abnormal results are displayed) Labs Reviewed  COMPREHENSIVE METABOLIC PANEL - Abnormal; Notable for the following components:      Result Value   Potassium 3.0 (*)    Glucose, Bld 110 (*)  All other components within normal limits  CBC WITH DIFFERENTIAL/PLATELET  LIPASE, BLOOD    EKG None  Radiology No results found.  Procedures Procedures (including critical care time)  Medications Ordered in ED Medications  0.9 %  sodium chloride infusion ( Intravenous New Bag/Given 02/22/18 1855)  sodium chloride 0.9 % bolus 500 mL (0 mLs Intravenous Stopped 02/22/18 1815)  potassium chloride SA (K-DUR,KLOR-CON) CR tablet 40 mEq (40 mEq Oral Given 02/22/18 1900)     Initial Impression / Assessment and Plan / ED Course  I have reviewed the triage vital signs and the nursing notes.  Pertinent labs & imaging results that were available during my care of the patient were reviewed by me and considered in my medical decision making (see chart for details).    Patient received IV fluids here.  No significant abdominal tenderness.  Patient nontoxic no acute distress.  Labs showed some mild hypokalemia.  Patient received 40 mg potassium p.o.  Patient has potassium at home normally takes 1 tablet daily.  Potassium probably got low during the diarrhea illness.  Recommend follow-up with his doctors and with GI medicine.   Final Clinical Impressions(s) / ED Diagnoses   Final diagnoses:  Diarrhea,  unspecified type  Hypokalemia    ED Discharge Orders    None       Fredia Sorrow, MD 02/22/18 2006

## 2018-09-02 IMAGING — CT CT CERVICAL SPINE W/O CM
4 of 7 series · 14 of 33 positions shown, 15 images · non-contrast
Comparison: 07/25/2016

CLINICAL DATA: Fell from wheelchair.  Neck pain.

EXAM:
CT CERVICAL SPINE WITHOUT CONTRAST
TECHNIQUE: Multidetector CT imaging of the cervical spine was performed without
intravenous contrast. Multiplanar CT image reconstructions were also
generated.

[Series 7: c_spine 2.0 i30s 3 · axial · 0.41mm/px · z∈[-250,-154]mm · 4 of 81 slices shown, 5 images]
[im 17/81  soft-tissue]
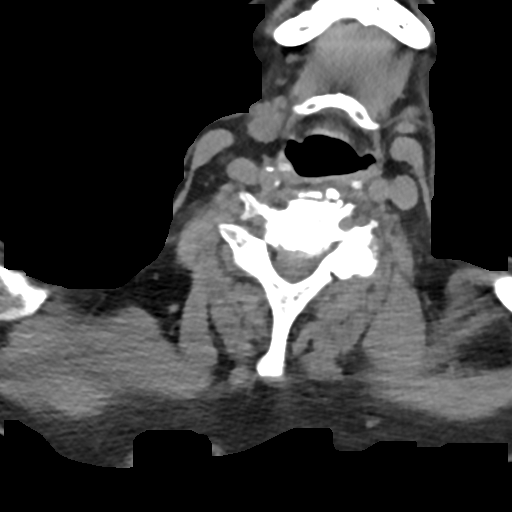
[im 17/81  bone]
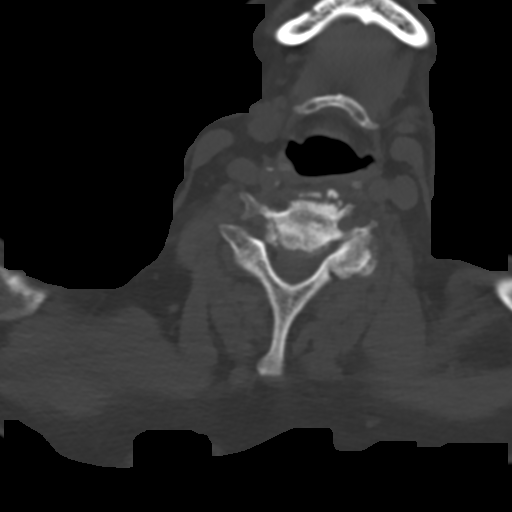
[im 33/81  bone]
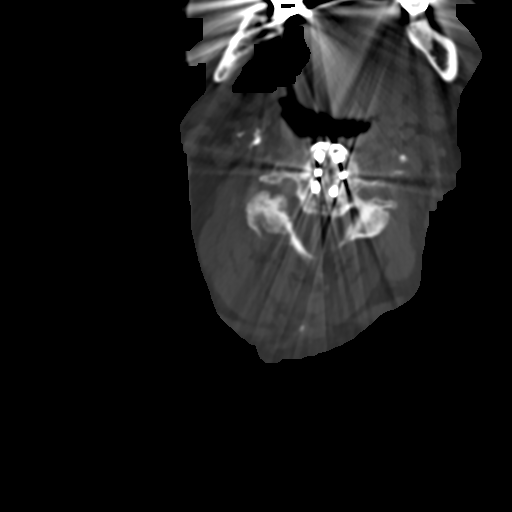
[im 49/81  bone]
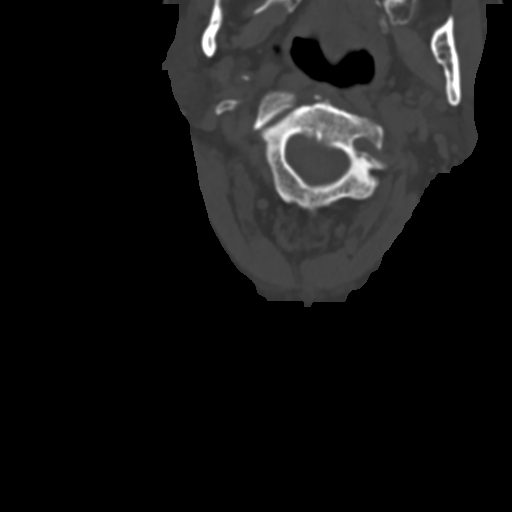
[im 65/81  bone]
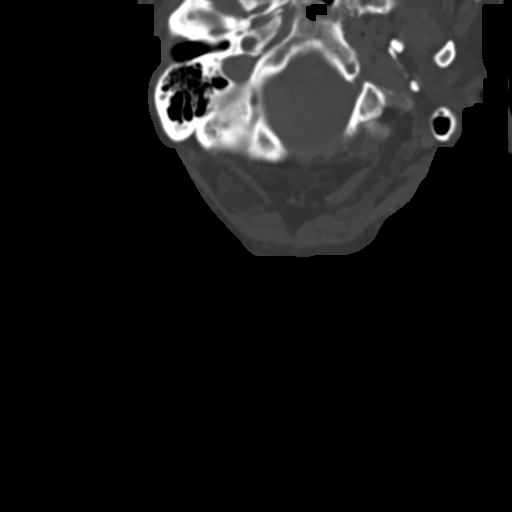

[Series 9: coronals · coronal · 0.35mm/px · 3 of 91 slices shown]
[im 39/91  bone]
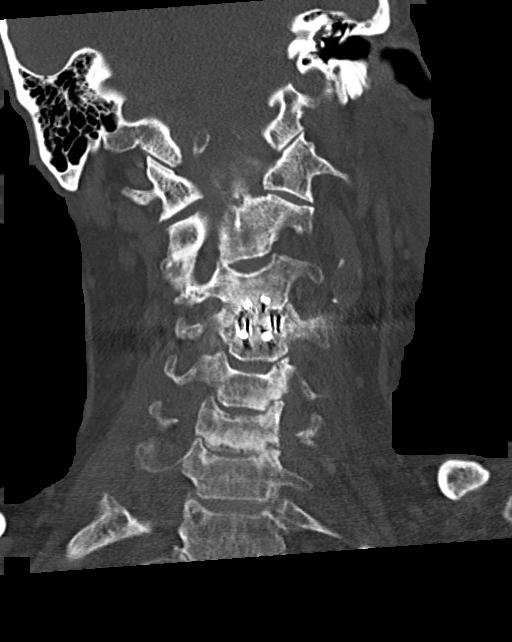
[im 63/91  bone]
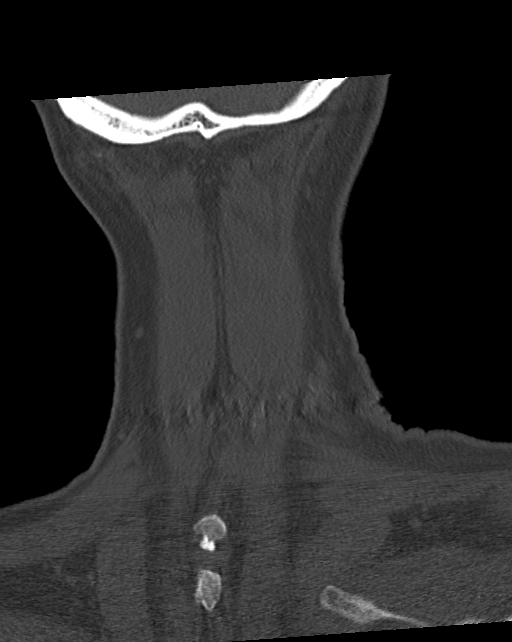
[im 87/91  bone]
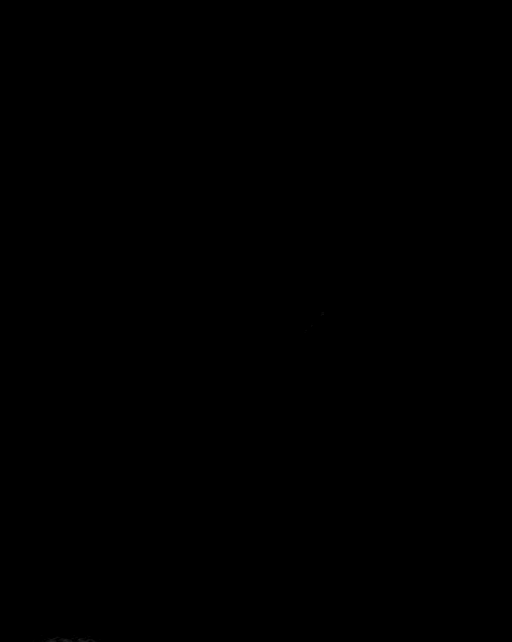

[Series 10: sagittals · sagittal · 0.25mm/px · 5 of 81 slices shown]
[im 12/81  bone]
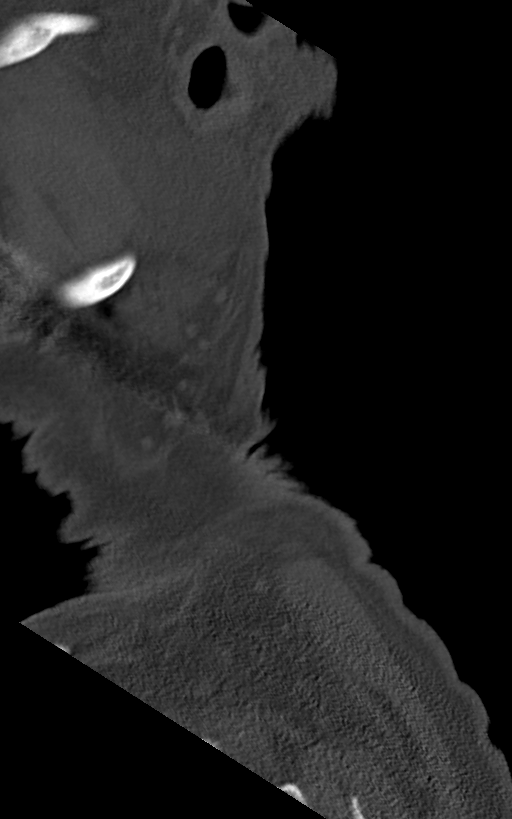
[im 23/81  bone]
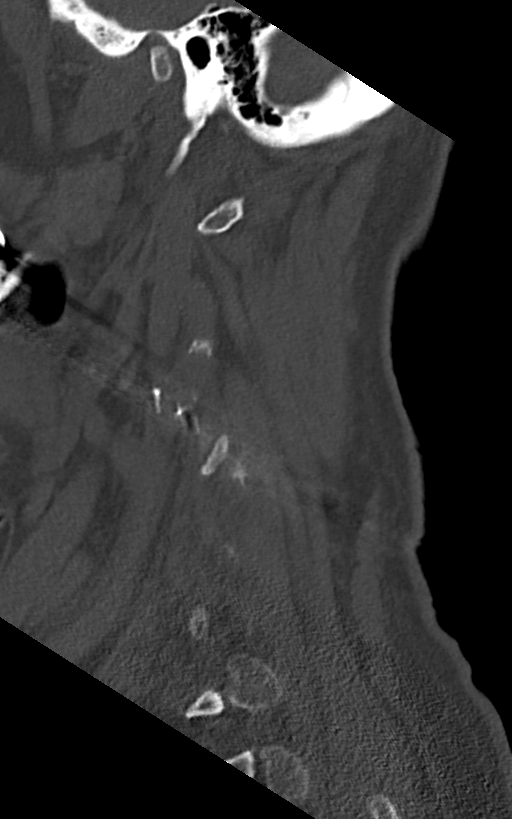
[im 35/81  bone]
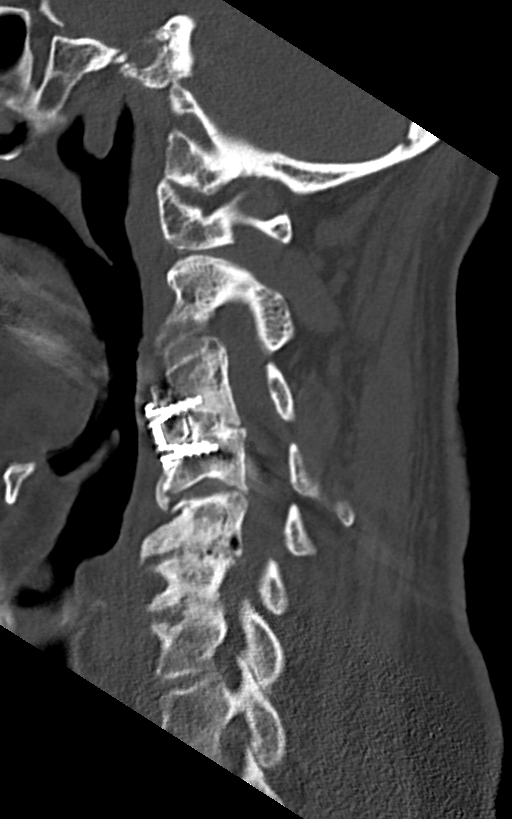
[im 46/81  bone]
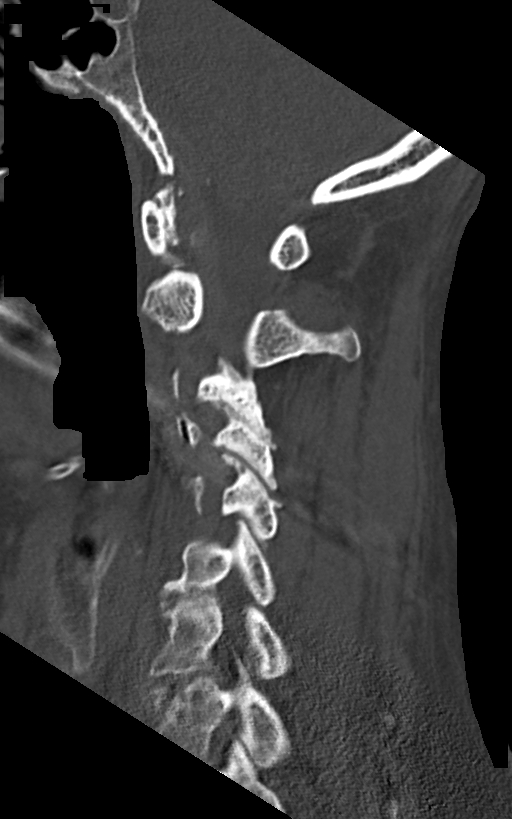
[im 58/81  bone]
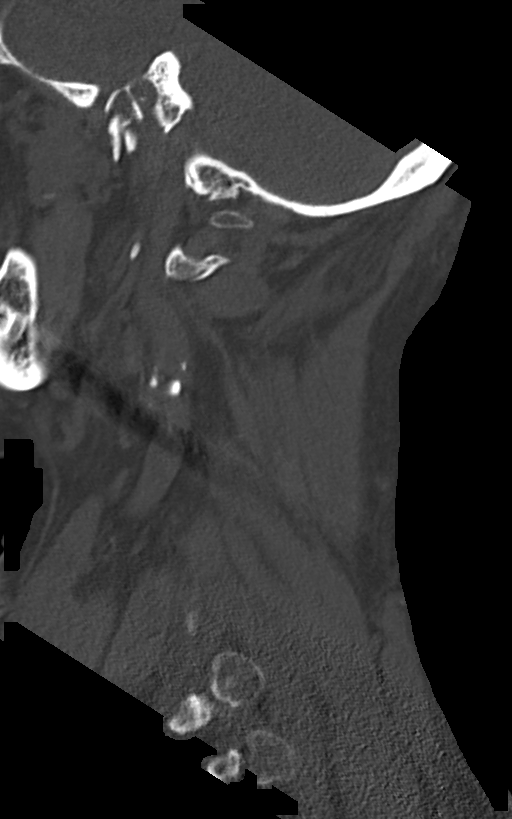

[Series 11: orthogonals · axial · 0.27mm/px · z∈[-288,-258]mm · 2 of 92 slices shown]
[im 19/92  bone]
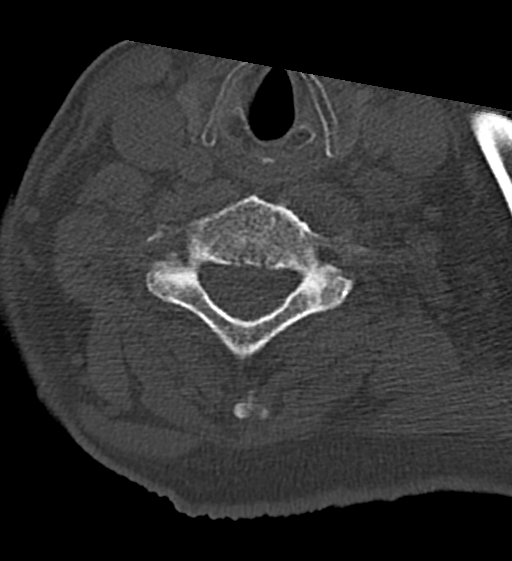
[im 37/92  bone]
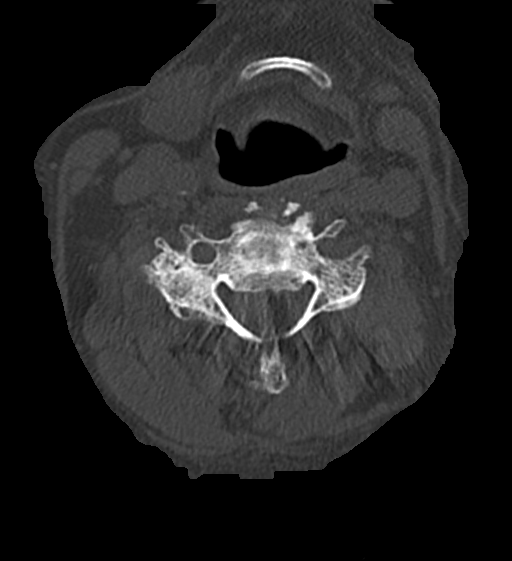

[14 of 33 positions shown; findings below may reference images not displayed]

FINDINGS: Alignment: Straightening of the normal lordosis. Mild curvature
convex to the left.

Skull base and vertebrae: No fracture.

Soft tissues and spinal canal: Negative

Disc levels:  C1-2: Ordinary osteoarthritis.

C2-3:  Posterior element fusion.  No stenosis.

C3-4:  Previous ACDF.  Solid fusion.  No stenosis.

C4-5: Facet osteoarthritis with 2 mm anterolisthesis. Bilateral
foraminal narrowing.

C5-6: Spondylosis with endplate osteophytes. Facet osteoarthritis.
Bilateral foraminal stenosis.

C6-7: Spondylosis. Facet arthritis. Bilateral foraminal narrowing.

C7-T1: Endplate osteophytes. Facet osteoarthritis. No compressive
stenosis.

Upper chest: Negative

Other: Negative
IMPRESSION: No acute or traumatic finding. Previous ACDF C3-4. Chronic bone
fusion posteriorly at C2-3.

Bilateral lateral recess stenosis at C4-5, C5-6 and C6-7 that could
be symptomatic.

## 2019-08-03 DIAGNOSIS — F419 Anxiety disorder, unspecified: Secondary | ICD-10-CM | POA: Insufficient documentation

## 2020-02-10 ENCOUNTER — Encounter (HOSPITAL_BASED_OUTPATIENT_CLINIC_OR_DEPARTMENT_OTHER): Payer: Self-pay | Admitting: *Deleted

## 2020-02-10 ENCOUNTER — Other Ambulatory Visit: Payer: Self-pay

## 2020-02-10 ENCOUNTER — Emergency Department (HOSPITAL_BASED_OUTPATIENT_CLINIC_OR_DEPARTMENT_OTHER): Payer: Medicare PPO

## 2020-02-10 ENCOUNTER — Emergency Department (HOSPITAL_BASED_OUTPATIENT_CLINIC_OR_DEPARTMENT_OTHER)
Admission: EM | Admit: 2020-02-10 | Discharge: 2020-02-10 | Disposition: A | Discharge status: Home / Self Care | Payer: Medicare PPO | Attending: Emergency Medicine | Admitting: Emergency Medicine

## 2020-02-10 DIAGNOSIS — I2510 Atherosclerotic heart disease of native coronary artery without angina pectoris: Secondary | ICD-10-CM | POA: Diagnosis not present

## 2020-02-10 DIAGNOSIS — I10 Essential (primary) hypertension: Secondary | ICD-10-CM | POA: Insufficient documentation

## 2020-02-10 DIAGNOSIS — Z79899 Other long term (current) drug therapy: Secondary | ICD-10-CM | POA: Diagnosis not present

## 2020-02-10 DIAGNOSIS — Z7982 Long term (current) use of aspirin: Secondary | ICD-10-CM | POA: Diagnosis not present

## 2020-02-10 DIAGNOSIS — Z7901 Long term (current) use of anticoagulants: Secondary | ICD-10-CM | POA: Diagnosis not present

## 2020-02-10 DIAGNOSIS — R531 Weakness: Secondary | ICD-10-CM | POA: Insufficient documentation

## 2020-02-10 LAB — COMPREHENSIVE METABOLIC PANEL
ALT: 22 U/L (ref 0–44)
AST: 21 U/L (ref 15–41)
Albumin: 4.2 g/dL (ref 3.5–5.0)
Alkaline Phosphatase: 110 U/L (ref 38–126)
Anion gap: 12 (ref 5–15)
BUN: 24 mg/dL — ABNORMAL HIGH (ref 8–23)
CO2: 23 mmol/L (ref 22–32)
Calcium: 9.1 mg/dL (ref 8.9–10.3)
Chloride: 98 mmol/L (ref 98–111)
Creatinine, Ser: 0.89 mg/dL (ref 0.61–1.24)
GFR calc Af Amer: >60 mL/min (ref >60)
GFR calc non Af Amer: >60 mL/min (ref >60)
Glucose, Bld: 136 mg/dL — ABNORMAL HIGH (ref 70–99)
Potassium: 3.5 mmol/L (ref 3.5–5.1)
Sodium: 133 mmol/L — ABNORMAL LOW (ref 135–145)
Total Bilirubin: 1.4 mg/dL — ABNORMAL HIGH (ref 0.3–1.2)
Total Protein: 7.2 g/dL (ref 6.5–8.1)

## 2020-02-10 LAB — URINALYSIS, ROUTINE W REFLEX MICROSCOPIC
Bilirubin Urine: NEGATIVE
Glucose, UA: NEGATIVE mg/dL
Ketones, ur: NEGATIVE mg/dL
Nitrite: NEGATIVE
Protein, ur: NEGATIVE mg/dL
Specific Gravity, Urine: 1.02 (ref 1.005–1.030)
pH: 8 (ref 5.0–8.0)

## 2020-02-10 LAB — CBC WITH DIFFERENTIAL/PLATELET
Abs Immature Granulocytes: 0.05 10*3/uL (ref 0.00–0.07)
Basophils Absolute: 0.1 10*3/uL (ref 0.0–0.1)
Basophils Relative: 1 %
Eosinophils Absolute: 0.2 10*3/uL (ref 0.0–0.5)
Eosinophils Relative: 2 %
HCT: 41.9 % (ref 39.0–52.0)
Hemoglobin: 14.1 g/dL (ref 13.0–17.0)
Immature Granulocytes: 1 %
Lymphocytes Relative: 7 %
Lymphs Abs: 0.7 10*3/uL (ref 0.7–4.0)
MCH: 29.4 pg (ref 26.0–34.0)
MCHC: 33.7 g/dL (ref 30.0–36.0)
MCV: 87.5 fL (ref 80.0–100.0)
Monocytes Absolute: 0.9 10*3/uL (ref 0.1–1.0)
Monocytes Relative: 8 %
Neutro Abs: 8.7 10*3/uL — ABNORMAL HIGH (ref 1.7–7.7)
Neutrophils Relative %: 81 %
Platelets: 202 10*3/uL (ref 150–400)
RBC: 4.79 MIL/uL (ref 4.22–5.81)
RDW: 15.8 % — ABNORMAL HIGH (ref 11.5–15.5)
WBC: 10.7 10*3/uL — ABNORMAL HIGH (ref 4.0–10.5)
nRBC: 0 % (ref 0.0–0.2)

## 2020-02-10 LAB — LIPASE, BLOOD: Lipase: 31 U/L (ref 11–51)

## 2020-02-10 LAB — LACTIC ACID, PLASMA: Lactic Acid, Venous: 0.8 mmol/L (ref 0.5–1.9)

## 2020-02-10 LAB — URINALYSIS, MICROSCOPIC (REFLEX)

## 2020-02-10 LAB — TROPONIN I (HIGH SENSITIVITY): Troponin I (High Sensitivity): 12 ng/L (ref <18)

## 2020-02-10 MED ORDER — LACTATED RINGERS IV BOLUS
1000.0000 mL | Freq: Once | INTRAVENOUS | Status: AC
  Administered 2020-02-10: 1000 mL via INTRAVENOUS

## 2020-02-10 NOTE — ED Notes (Signed)
ED Provider at bedside. 

## 2020-02-10 NOTE — Discharge Instructions (Signed)
Make sure you are eating and drinking even if you do not feel like it.  If you start developing any confusion, pain in your abdomen, vomiting or fever you should return to the emergency room.  Ensure that you are taking your medications as prescribed but you need to see your doctor to see if there needs to be adjustments in your medicines.  Also keep a record of your blood pressure daily.

## 2020-02-10 NOTE — ED Provider Notes (Signed)
[facility] EMERGENCY DEPARTMENT Provider Note   CSN: [id number] Arrival date & time: 02/10/20  1156     History Chief Complaint  Patient presents with  . Hypertension    Alexander Friedman is a 86 y.o. male.  Patient is an 86 year old male with a history of chronic afib on anticoagulation and hx of CAD, hypertension, chronic lower extremity weakness and neurogenic bladder from prior cervical spinal surgery who lives at home and is presenting today with complaint of weakness.  Patient reports he has not felt himself all week.  He describes it as feeling generally weak with no energy, significantly decreased appetite and just not feeling himself.  He denies any worsening shortness of breath or new cough.  He has had no chest pain, abdominal pain or change in his bowel habits.  He had his suprapubic catheter changed on 12/12/6501 without complication and feels that urine has been the same.  He denies any recent medication changes but states he has had difficulty sleeping and did take an Ambien and melatonin last night with minimal improvement.  Family with him reports he is really not eaten or drank much of anything this week.  No fever noted.  He does have numbness and tingling in his legs but denies any unilateral symptoms or headache.   Hypertension       Past Medical History:  Diagnosis Date  . Coronary artery disease   . Hypertension     There are no problems to display for this patient.   Past Surgical History:  Procedure Laterality Date  . BACK SURGERY    . [place]  . EYE SURGERY         No family history on file.  Social History   Tobacco Use  . Smoking status: Never Smoker  . Smokeless tobacco: Never Used  Substance Use Topics  . Alcohol use: No  . Drug use: No    Home Medications Prior to Admission medications   Medication Sig Start Date End Date Taking? Authorizing Provider  aspirin 81 MG chewable tablet Chew by mouth  daily.    [provider]  atorvastatin (LIPITOR) 20 MG tablet Take 20 mg by mouth daily.    [provider]  Calcium Carb-Cholecalciferol (CALCIUM-VITAMIN D) 500-200 MG-UNIT tablet Take 1 tablet by mouth daily.    [provider]  cephALEXin (KEFLEX) 500 MG capsule Take 1 capsule (500 mg total) by mouth 2 (two) times daily. 07/29/17   Julianne Rice, MD  chlorthalidone (HYGROTON) 25 MG tablet Take 25 mg by mouth 2 (two) times daily.    [provider]  clotrimazole-betamethasone (LOTRISONE) cream Apply 1 application topically 2 (two) times daily as needed.    [provider]  cyanocobalamin (,VITAMIN B-12,) 1000 MCG/ML injection Inject 1,000 mcg into the muscle See admin instructions. Every 2 months    [provider]  ferrous sulfate 325 (65 FE) MG tablet Take 325 mg by mouth daily with breakfast.    [provider]  FIBER PO Take by mouth 2 (two) times daily.    [provider]  Fluticasone-Salmeterol (AIRDUO RESPICLICK 54/65 IN) Inhale into the lungs daily.    [provider]  HYDROmorphone HCl (EXALGO) 12 MG T24A SR tablet Take 12 mg by mouth daily.    [provider]  meloxicam (MOBIC) 7.5 MG tablet Take 7.5 mg by mouth daily.    [provider]  montelukast (SINGULAIR) 10 MG  tablet Take 10 mg by mouth at bedtime.    [provider]  nitroGLYCERIN (NITROSTAT) 0.4 MG SL tablet Place 0.4 mg under the tongue every 5 (five) minutes as needed for chest pain.    [provider]  omeprazole (PRILOSEC) 40 MG capsule Take 40 mg by mouth daily.    [provider]  oxymorphone (OPANA) 5 MG tablet Take 5 mg by mouth every 8 (eight) hours as needed for pain.    [provider]  polyethylene glycol (MIRALAX / GLYCOLAX) packet Take 17 g by mouth daily as needed.    [provider]  potassium chloride SA (K-DUR,KLOR-CON) 20 MEQ tablet Take 2 tablets (40 mEq total)  by mouth daily. X 3 day 07/29/17   Julianne Rice, MD  pregabalin (LYRICA) 75 MG capsule Take 75 mg by mouth 2 (two) times daily.    [provider]  Probiotic Product (4X PROBIOTIC PO) Take by mouth daily.    [provider]  sertraline (ZOLOFT) 100 MG tablet Take 100 mg by mouth daily.    [provider]  solifenacin (VESICARE) 10 MG tablet Take by mouth daily.    [provider]  telmisartan (MICARDIS) 80 MG tablet Take 80 mg by mouth daily.    [provider]  valACYclovir (VALTREX) 1000 MG tablet Take 1,000 mg by mouth daily as needed.    [provider]  warfarin (COUMADIN) 1 MG tablet Take 4.5 mg by mouth See admin instructions. Take 4.5mg  every Tuesday, Thursday and Saturday    [provider]  warfarin (COUMADIN) 3 MG tablet Take 3 mg by mouth See admin instructions. Take 1 tab every Monday, Wednesday, Friday and Sunday    [provider]  zolpidem (AMBIEN) 5 MG tablet Take 5 mg by mouth at bedtime as needed for sleep.    [provider]    Allergies    Patient has no known allergies.  Review of Systems   Review of Systems  All other systems reviewed and are negative.   Physical Exam Updated Vital Signs BP (!) 152/78 (BP Location: Right Arm)   Pulse 77   Resp 15   SpO2 95%   Physical Exam Vitals and nursing note reviewed.  Constitutional:      General: He is not in acute distress.    Appearance: Normal appearance. He is well-developed.  HENT:     Head: Normocephalic and atraumatic.  Eyes:     Conjunctiva/sclera: Conjunctivae normal.     Pupils: Pupils are equal, round, and reactive to light.  Cardiovascular:     Rate and Rhythm: Normal rate. Rhythm irregularly irregular.     Heart sounds: No murmur heard.   Pulmonary:     Effort: Pulmonary effort is normal. No respiratory distress.     Breath sounds: Normal breath sounds. No wheezing or rales.  Abdominal:     General: There is no  distension.     Palpations: Abdomen is soft.     Tenderness: There is no abdominal tenderness. There is no guarding or rebound.  Musculoskeletal:        General: No tenderness. Normal range of motion.     Cervical back: Normal range of motion and neck supple.     Right lower leg: No edema.     Left lower leg: No edema.  Skin:    General: Skin is warm and dry.     Findings: No erythema or rash.     Comments: Skin  changes on the lower extremities consistent with chronic venous stasis  Neurological:     General: No focal deficit present.     Mental Status: He is alert and oriented to person, place, and time. Mental status is at baseline.     Comments: Patient is able to live each leg off the bed with 4-5 strength in the lower extremities.  Psychiatric:        Mood and Affect: Mood normal.        Behavior: Behavior normal.      ED Results / Procedures / Treatments   Labs (all labs ordered are listed, but only abnormal results are displayed) Labs Reviewed  URINE CULTURE  CBC WITH DIFFERENTIAL/PLATELET  COMPREHENSIVE METABOLIC PANEL  LIPASE, BLOOD  LACTIC ACID, PLASMA  URINALYSIS, ROUTINE W REFLEX MICROSCOPIC  TROPONIN I (HIGH SENSITIVITY)    EKG EKG Interpretation  Date/Time:  Friday February 10 2020 12:14:19 EDT Ventricular Rate:  78 PR Interval:    QRS Duration: 122 QT Interval:  428 QTC Calculation: 488 R Axis:   -6 Text Interpretation: Atrial fibrillation Ventricular premature complex IVCD, consider atypical LBBB Confirmed by Blanchie Dessert [phone]) on 02/10/2020 12:25:46 PM   Radiology No results found.  Procedures Procedures (including critical care time)  Medications Ordered in ED Medications  lactated ringers bolus 1,000 mL (has no administration in time range)    ED Course  I have reviewed the triage vital signs and the nursing notes.  Pertinent labs & imaging results that were available during my care of the patient were reviewed by me and considered in  my medical decision making (see chart for details).    MDM Rules/Calculators/A&P                         Elderly male presenting today with complaints of weakness and not feeling himself over the last 1 week.  Patient denies any new cough, fever, shortness of breath or chest pain.  He has no reproducible abdominal pain.  Patient does have a suprapubic catheter which was replaced almost a month ago.  Patient denies any change in his urine.  He is complaining of generalized weakness but is able to lift each leg off the bed and has no focal weakness concerning for stroke.  Patient's vital signs have been stable here.  Patient has not been eating and drinking well and has not been sleeping well but denies any recent medication changes.  EKG shows atrial fibrillation which is chronic and patient does take anticoagulation for this.  No acute changes.  2:26 PM Patient's labs are consistent with a urine with moderate leukocytes and 21-50 white cells with low suspicion for infection at this time, lactic acid within normal limits, CBC with white count of 10 with normal hemoglobin and platelet count.  LFTs today are within normal limits totally bilirubin is elevated at 1.4.  Renal function unchanged in minimal hyponatremia of 133.  Lipase, troponin and chest x-ray without acute findings.  Patient's daughter is now present and reports that she is concerned he may not be taking his sertraline the way prescribed and he does seem to worry a lot.  She thinks this is adding into why he is not eating why is not having good sleep.  She reports he has not been confused or symptoms concerning more for infection.  She has been encouraging her father to follow-up with his PCP but he has not done that this week and she  plans on calling them for a follow-up appointment next week.  Patient is stable at this time.  No acute findings on labs.  Patient was encouraged to increase p.o. intake and did give return  precautions.  MDM Number of Diagnoses or Management Options   Amount and/or Complexity of Data Reviewed Clinical lab tests: ordered and reviewed Tests in the radiology section of CPT: ordered and reviewed Tests in the medicine section of CPT: ordered and reviewed Decide to obtain previous medical records or to obtain history from someone other than the patient: yes Obtain history from someone other than the patient: yes Review and summarize past medical records: yes Discuss the patient with other providers: no Independent visualization of images, tracings, or specimens: yes  Risk of Complications, Morbidity, and/or Mortality Presenting problems: moderate Diagnostic procedures: low Management options: low  Patient Progress Patient progress: stable   Final Clinical Impression(s) / ED Diagnoses Final diagnoses:  Weakness    Rx / DC Orders ED Discharge Orders    None       Blanchie Dessert, MD 02/10/20 1430

## 2020-02-10 NOTE — ED Triage Notes (Signed)
States bp was 146/105  And hr 71 this am   States bp up and down x 1 week,  Weakness,  Not eating, feet numbness off and on

## 2020-02-13 LAB — URINE CULTURE: Culture: >=100000 — AB

## 2020-05-24 ENCOUNTER — Emergency Department (HOSPITAL_BASED_OUTPATIENT_CLINIC_OR_DEPARTMENT_OTHER)
Admission: EM | Admit: 2020-05-24 | Discharge: 2020-05-24 | Disposition: A | Discharge status: Home / Self Care | Payer: Medicare PPO | Attending: Emergency Medicine | Admitting: Emergency Medicine

## 2020-05-24 ENCOUNTER — Emergency Department (HOSPITAL_BASED_OUTPATIENT_CLINIC_OR_DEPARTMENT_OTHER): Payer: Medicare PPO

## 2020-05-24 ENCOUNTER — Other Ambulatory Visit: Payer: Self-pay

## 2020-05-24 ENCOUNTER — Encounter (HOSPITAL_BASED_OUTPATIENT_CLINIC_OR_DEPARTMENT_OTHER): Payer: Self-pay | Admitting: *Deleted

## 2020-05-24 DIAGNOSIS — Z79899 Other long term (current) drug therapy: Secondary | ICD-10-CM | POA: Diagnosis not present

## 2020-05-24 DIAGNOSIS — I2510 Atherosclerotic heart disease of native coronary artery without angina pectoris: Secondary | ICD-10-CM | POA: Insufficient documentation

## 2020-05-24 DIAGNOSIS — I10 Essential (primary) hypertension: Secondary | ICD-10-CM | POA: Diagnosis not present

## 2020-05-24 DIAGNOSIS — M25522 Pain in left elbow: Secondary | ICD-10-CM | POA: Insufficient documentation

## 2020-05-24 DIAGNOSIS — M19029 Primary osteoarthritis, unspecified elbow: Secondary | ICD-10-CM

## 2020-05-24 DIAGNOSIS — Z7901 Long term (current) use of anticoagulants: Secondary | ICD-10-CM | POA: Diagnosis not present

## 2020-05-24 DIAGNOSIS — Z7982 Long term (current) use of aspirin: Secondary | ICD-10-CM | POA: Diagnosis not present

## 2020-05-24 DIAGNOSIS — L03114 Cellulitis of left upper limb: Secondary | ICD-10-CM

## 2020-05-24 MED ORDER — DOXYCYCLINE HYCLATE 100 MG PO CAPS: 100.0000 mg | ORAL_CAPSULE | Freq: Two times a day (BID) | ORAL | 0 refills | Status: AC

## 2020-05-24 MED ORDER — TRAMADOL HCL 50 MG PO TABS: 50.0000 mg | ORAL_TABLET | Freq: Four times a day (QID) | ORAL | 0 refills | Status: AC | PRN

## 2020-05-24 MED ORDER — TRAMADOL HCL 50 MG PO TABS
50.0000 mg | ORAL_TABLET | Freq: Once | ORAL | Status: AC
  Administered 2020-05-24: 50 mg via ORAL
  Filled 2020-05-24: qty 1

## 2020-05-24 MED ORDER — DOXYCYCLINE HYCLATE 100 MG PO TABS
100.0000 mg | ORAL_TABLET | Freq: Once | ORAL | Status: AC
  Administered 2020-05-24: 100 mg via ORAL
  Filled 2020-05-24: qty 1

## 2020-05-24 NOTE — Discharge Instructions (Signed)
You may have some inflammation or early skin infection around the left elbow.  Take Tylenol for pain and take tramadol for severe pain.  Take doxycycline twice daily for a week.  Follow-up with your orthopedic doctor  Return to ER with if you have worse elbow pain or swelling, fevers.

## 2020-05-24 NOTE — ED Provider Notes (Signed)
[facility] Provider Note   CSN: [id number] Arrival date & time: 05/24/20  1634     History Chief Complaint  Patient presents with   Elbow Pain    Alexander Friedman is a 86 y.o. male history of CAD and arthritis and hypertension here presenting with left elbow pain.  Patient states that it started yesterday.  He may have injured it and bumped into something.  Patient states that it was red and swollen.  Patient states that he has been taking Tylenol with minimal relief.  Denies any fever.  Denies any history of joint infection   The history is provided by the patient.       Past Medical History:  Diagnosis Date   Coronary artery disease    Hypertension     There are no problems to display for this patient.   Past Surgical History:  Procedure Laterality Date   BACK SURGERY     CARDIAC SURGERY     3V 1992   EYE SURGERY         No family history on file.  Social History   Tobacco Use   Smoking status: Never Smoker   Smokeless tobacco: Never Used  Substance Use Topics   Alcohol use: No   Drug use: No    Home Medications Prior to Admission medications   Medication Sig Start Date End Date Taking? Authorizing Provider  aspirin 81 MG chewable tablet Chew by mouth daily.    [provider]  atorvastatin (LIPITOR) 20 MG tablet Take 20 mg by mouth daily.    [provider]  Calcium Carb-Cholecalciferol (CALCIUM-VITAMIN D) 500-200 MG-UNIT tablet Take 1 tablet by mouth daily.    [provider]  cephALEXin (KEFLEX) 500 MG capsule Take 1 capsule (500 mg total) by mouth 2 (two) times daily. 07/29/17   Julianne Rice, MD  chlorthalidone (HYGROTON) 25 MG tablet Take 25 mg by mouth 2 (two) times daily.    [provider]  clotrimazole-betamethasone (LOTRISONE) cream Apply 1 application topically 2 (two) times daily as needed.    [provider]  cyanocobalamin (,VITAMIN B-12,) 1000  MCG/ML injection Inject 1,000 mcg into the muscle See admin instructions. Every 2 months    [provider]  ferrous sulfate 325 (65 FE) MG tablet Take 325 mg by mouth daily with breakfast.    [provider]  FIBER PO Take by mouth 2 (two) times daily.    [provider]  Fluticasone-Salmeterol (AIRDUO RESPICLICK 24/09 IN) Inhale into the lungs daily.    [provider]  HYDROmorphone HCl (EXALGO) 12 MG T24A SR tablet Take 12 mg by mouth daily.    [provider]  meloxicam (MOBIC) 7.5 MG tablet Take 7.5 mg by mouth daily.    [provider]  montelukast (SINGULAIR) 10 MG tablet Take 10 mg by mouth at bedtime.    [provider]  nitroGLYCERIN (NITROSTAT) 0.4 MG SL tablet Place 0.4 mg under the tongue every 5 (five) minutes as needed for chest pain.    [provider]  omeprazole (PRILOSEC) 40 MG capsule Take 40 mg by mouth daily.    [provider]  oxymorphone (OPANA) 5 MG tablet Take 5 mg by mouth every 8 (eight) hours as needed for pain.    [provider]  polyethylene glycol (MIRALAX / GLYCOLAX) packet Take 17 g by mouth daily as needed.    [provider]  potassium chloride SA (K-DUR,KLOR-CON) 20  MEQ tablet Take 2 tablets (40 mEq total) by mouth daily. X 3 day 07/29/17   Julianne Rice, MD  pregabalin (LYRICA) 75 MG capsule Take 75 mg by mouth 2 (two) times daily.    [provider]  Probiotic Product (4X PROBIOTIC PO) Take by mouth daily.    [provider]  sertraline (ZOLOFT) 100 MG tablet Take 100 mg by mouth daily.    [provider]  solifenacin (VESICARE) 10 MG tablet Take by mouth daily.    [provider]  telmisartan (MICARDIS) 80 MG tablet Take 80 mg by mouth daily.    [provider]  valACYclovir (VALTREX) 1000 MG tablet Take 1,000 mg by mouth daily as needed.    [provider]  warfarin (COUMADIN) 1 MG tablet Take 4.5 mg  by mouth See admin instructions. Take 4.5mg  every Tuesday, Thursday and Saturday    [provider]  warfarin (COUMADIN) 3 MG tablet Take 3 mg by mouth See admin instructions. Take 1 tab every Monday, Wednesday, Friday and Sunday    [provider]  zolpidem (AMBIEN) 5 MG tablet Take 5 mg by mouth at bedtime as needed for sleep.    [provider]    Allergies    Patient has no known allergies.  Review of Systems   Review of Systems  Musculoskeletal:       L elbow pain   Skin: Positive for color change.  All other systems reviewed and are negative.   Physical Exam Updated Vital Signs BP 126/81    Pulse (!) 55    Temp 97.8 F (36.6 C) (Oral)    Resp 18    Ht 5\' 11"  (1.803 m)    Wt 92.5 kg    SpO2 97%    BMI 28.45 kg/m   Physical Exam Vitals and nursing note reviewed.  HENT:     Head: Normocephalic.     Mouth/Throat:     Mouth: Mucous membranes are moist.  Eyes:     Pupils: Pupils are equal, round, and reactive to light.  Cardiovascular:     Rate and Rhythm: Normal rate.     Pulses: Normal pulses.  Pulmonary:     Effort: Pulmonary effort is normal.  Abdominal:     General: Abdomen is flat.  Musculoskeletal:     Cervical back: Normal range of motion.     Comments: L elbow nl ROM.  No obvious joint infection.  Mild erythema posterior elbow area.  There is no joint swelling.   Skin:    General: Skin is warm.     Capillary Refill: Capillary refill takes less than 2 seconds.  Neurological:     General: No focal deficit present.     Mental Status: He is alert.  Psychiatric:        Mood and Affect: Mood normal.        Behavior: Behavior normal.     ED Results / Procedures / Treatments   Labs (all labs ordered are listed, but only abnormal results are displayed) Labs Reviewed - No data to display  EKG None  Radiology No results found.  Procedures Procedures (including critical care time)  Medications Ordered in ED Medications    traMADol (ULTRAM) tablet 50 mg (has no administration in time range)  doxycycline (VIBRA-TABS) tablet 100 mg (has no administration in time range)    ED Course  I have reviewed the triage vital signs and the nursing notes.  Pertinent labs & imaging  results that were available during my care of the patient were reviewed by me and considered in my medical decision making (see chart for details).    MDM Rules/Calculators/A&P                         Alexander Friedman is a 86 y.o. male here presenting with left elbow pain. There is no obvious joint infection.  There is some erythema by the elbow.  I think either inflammation versus early cellulitis.  I do not think he has a septic joint.  X-ray showed arthritis and no fracture.  Will discharge home with doxycycline.  Patient is on Coumadin.  Also give short course of tramadol for pain.   Final Clinical Impression(s) / ED Diagnoses Final diagnoses:  None    Rx / DC Orders ED Discharge Orders    None       Drenda Freeze, MD 05/24/20 1742

## 2020-05-24 NOTE — ED Triage Notes (Signed)
Left elbow pain since yesterday. No known injury. His family states she thinks he ran into something hitting his elbow. Redness yesterday.

## 2021-06-28 IMAGING — CR DG ELBOW COMPLETE 3+V*L*
3 series · 3 of 3 positions shown · non-contrast
Comparison: None.

CLINICAL DATA: Posterior elbow pain, no known injury, initial
encounter

EXAM:
LEFT ELBOW - COMPLETE 3+ VIEW

[x elbow joint ap left]
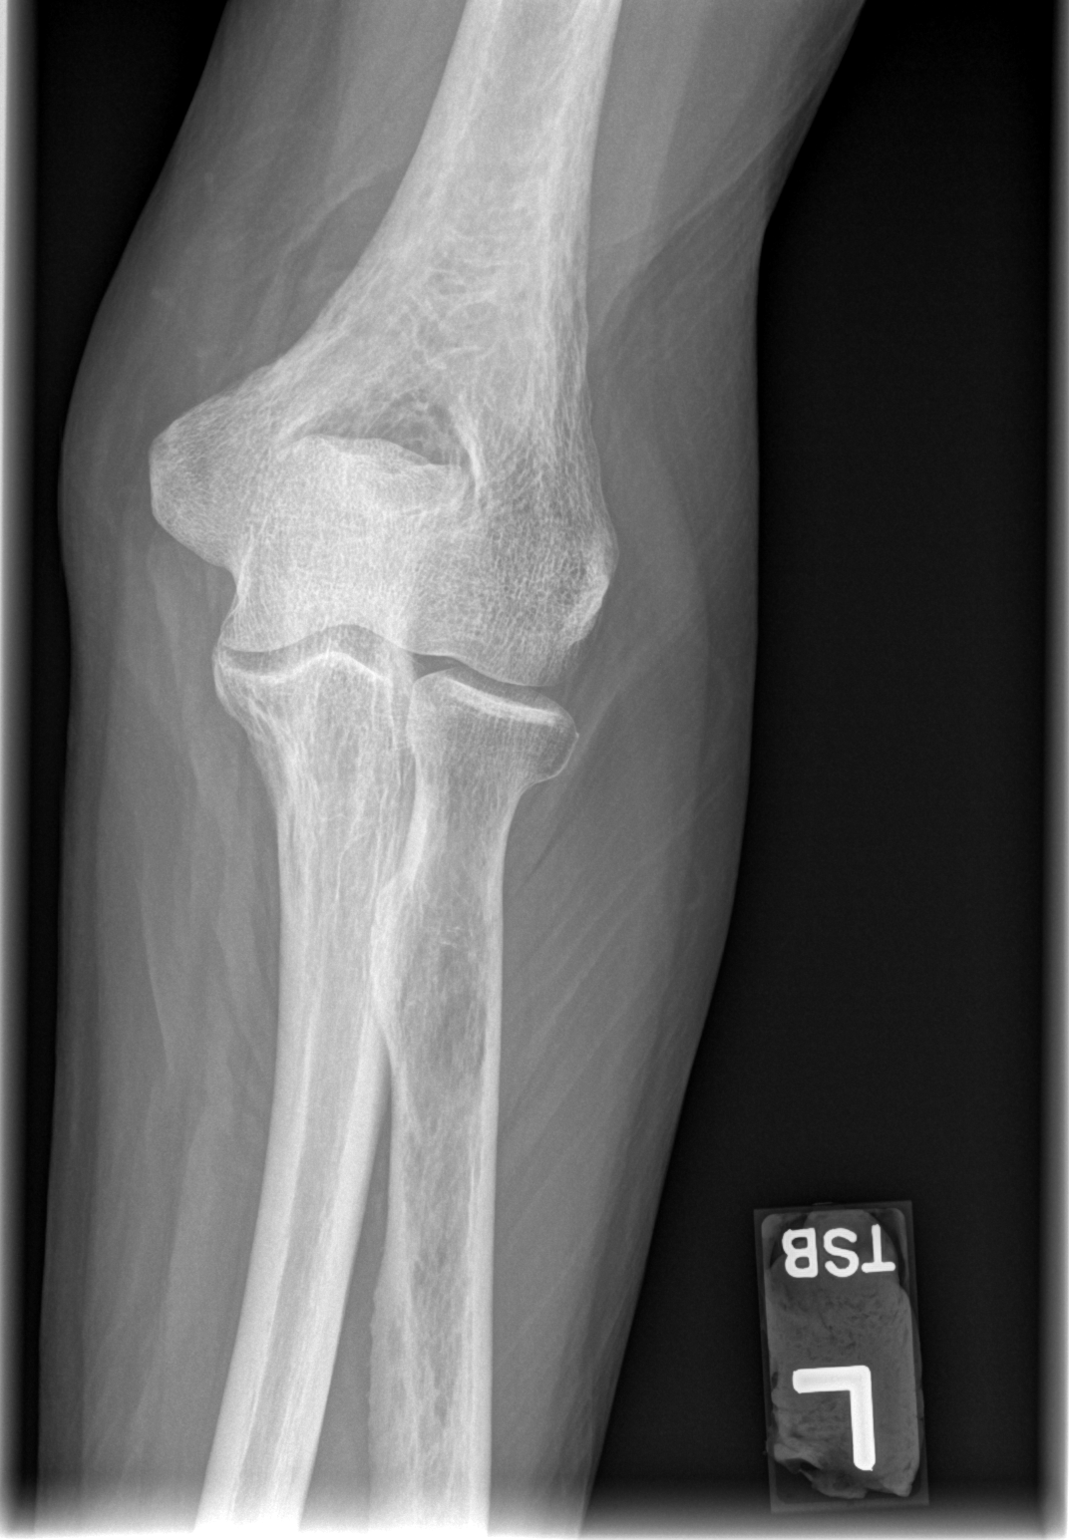

[x elbow joint obl. left (1 of 2)]
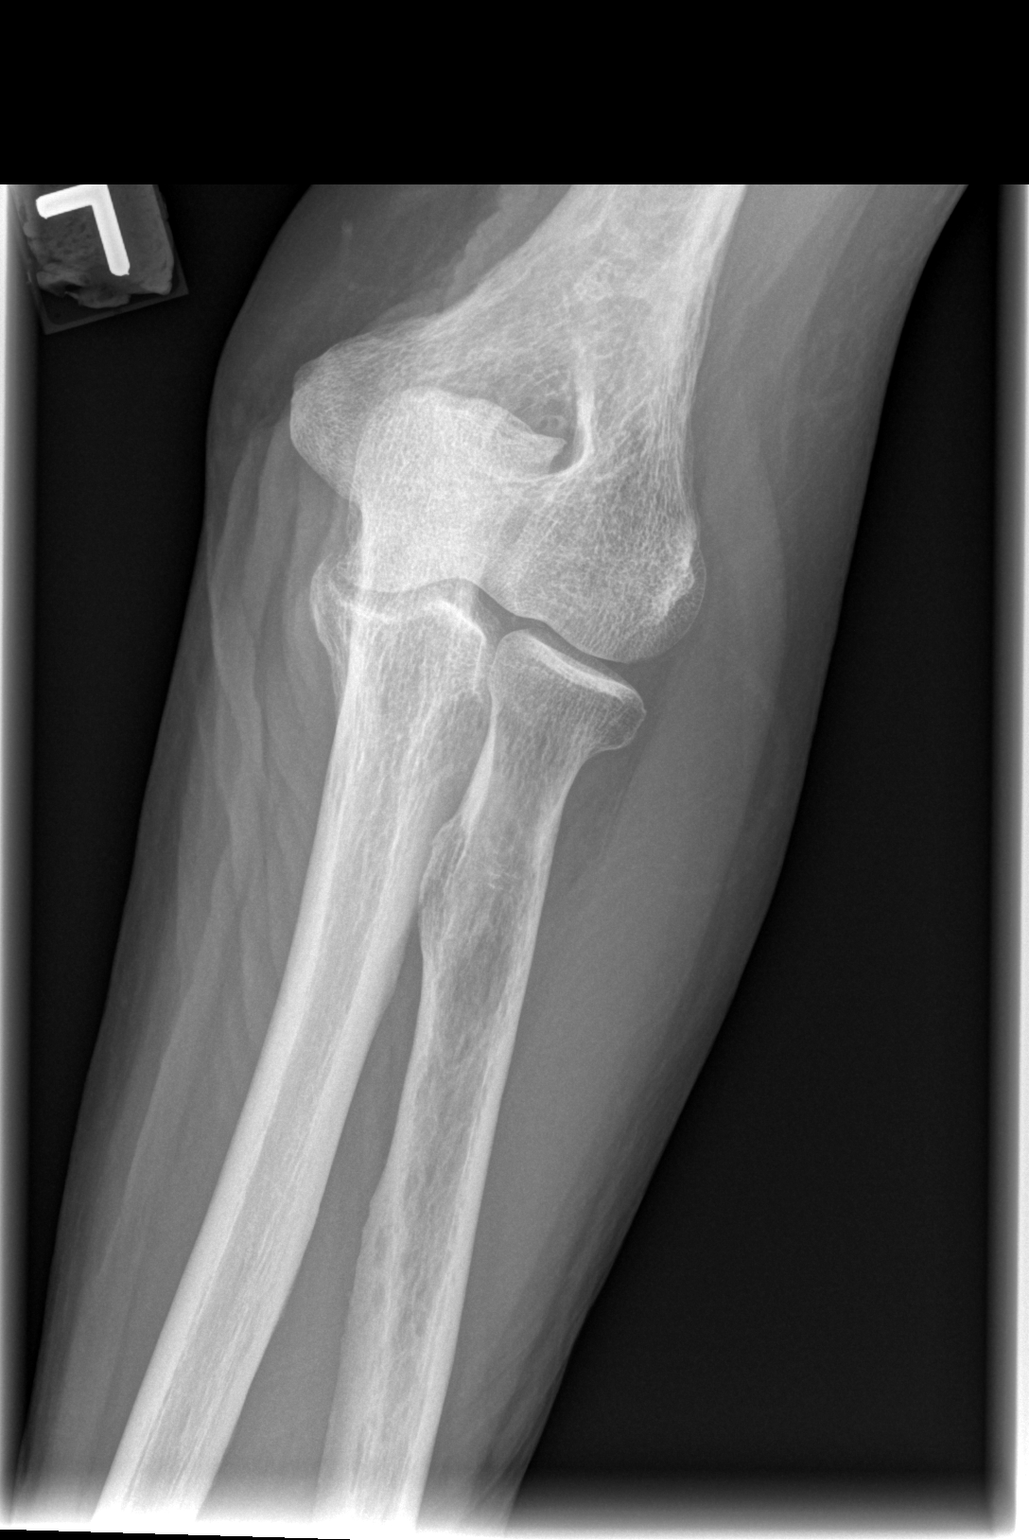

[x elbow joint obl. left (2 of 2)]
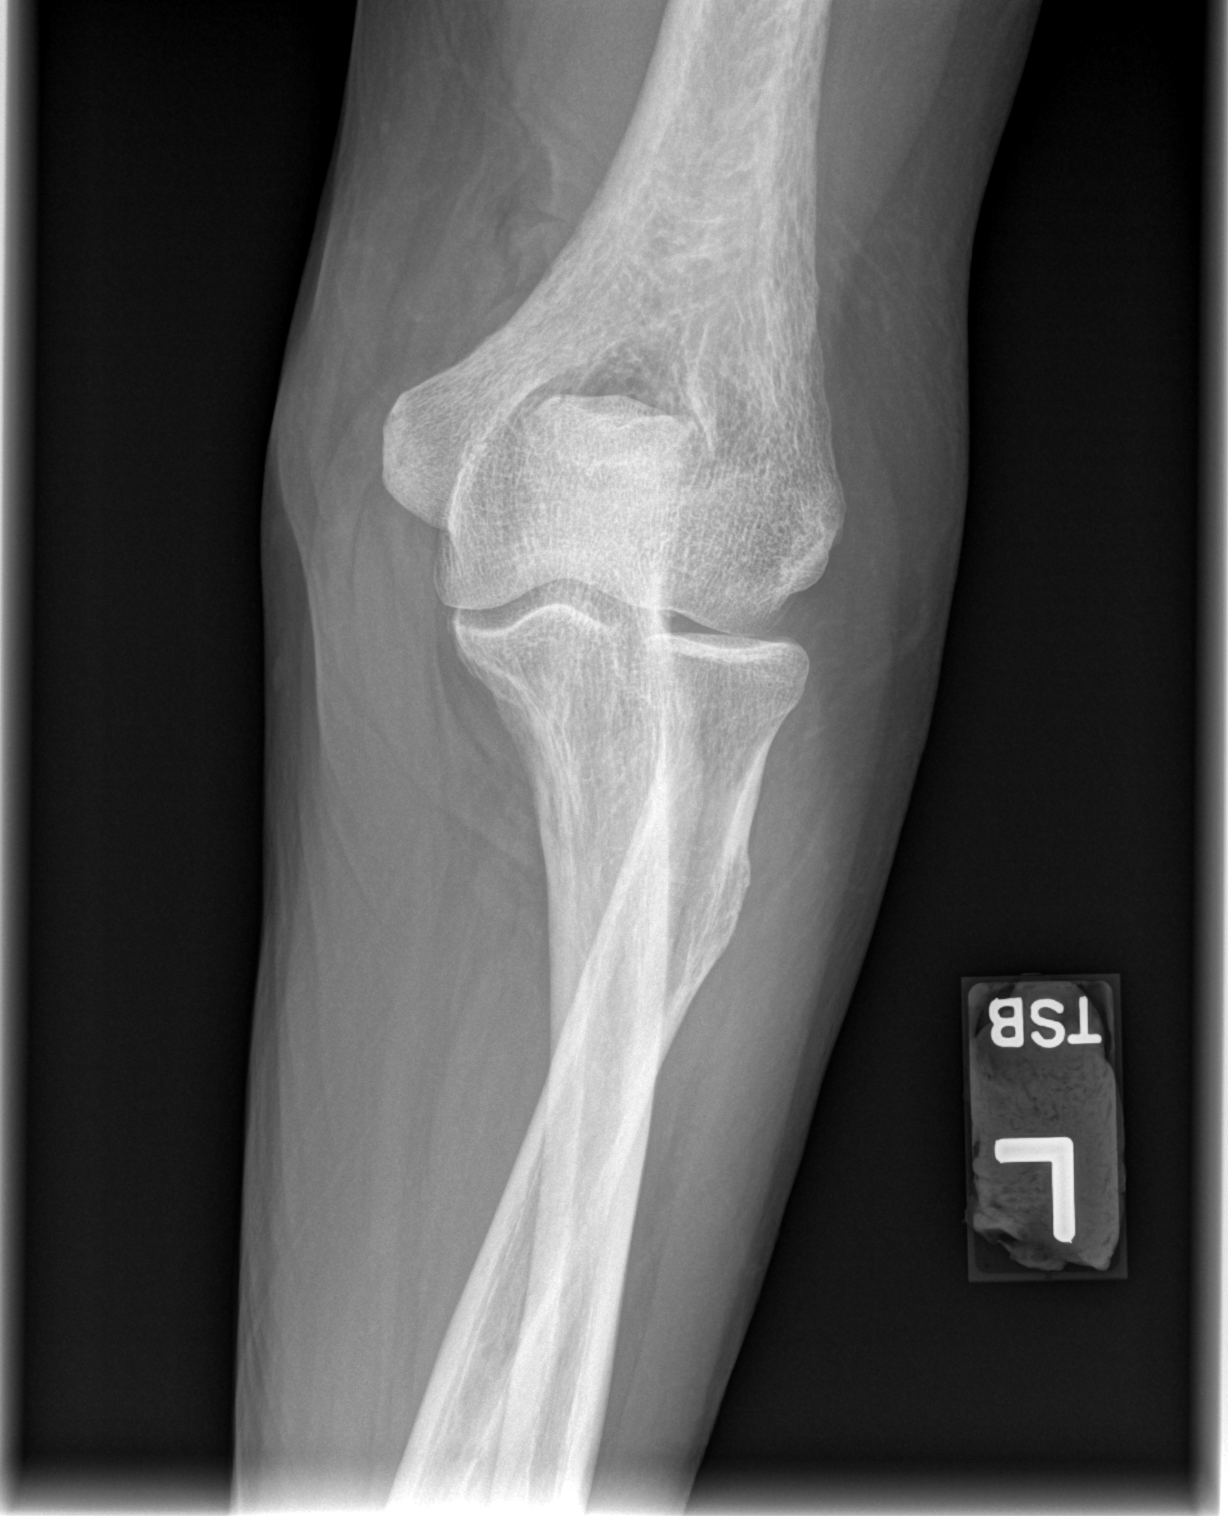

[3 of 3 positions shown; findings below may reference images not displayed]

FINDINGS: No acute fracture or dislocation is noted. Mild posterior soft
tissue swelling is noted which may be related to olecranon bursitis.
No other focal abnormality is seen.
IMPRESSION: Mild posterior soft tissue swelling without acute bony abnormality.

## 2022-01-03 ENCOUNTER — Other Ambulatory Visit: Payer: Self-pay

## 2022-01-03 ENCOUNTER — Encounter (HOSPITAL_BASED_OUTPATIENT_CLINIC_OR_DEPARTMENT_OTHER): Payer: Self-pay | Admitting: Emergency Medicine

## 2022-01-03 ENCOUNTER — Emergency Department (HOSPITAL_BASED_OUTPATIENT_CLINIC_OR_DEPARTMENT_OTHER)
Admission: EM | Admit: 2022-01-03 | Discharge: 2022-01-03 | Disposition: A | Discharge status: Home / Self Care | Payer: Medicare PPO | Attending: Emergency Medicine | Admitting: Emergency Medicine

## 2022-01-03 DIAGNOSIS — K922 Gastrointestinal hemorrhage, unspecified: Secondary | ICD-10-CM | POA: Diagnosis not present

## 2022-01-03 DIAGNOSIS — R197 Diarrhea, unspecified: Secondary | ICD-10-CM | POA: Diagnosis present

## 2022-01-03 DIAGNOSIS — Z7982 Long term (current) use of aspirin: Secondary | ICD-10-CM | POA: Insufficient documentation

## 2022-01-03 DIAGNOSIS — Z7901 Long term (current) use of anticoagulants: Secondary | ICD-10-CM | POA: Insufficient documentation

## 2022-01-03 LAB — OCCULT BLOOD X 1 CARD TO LAB, STOOL: Fecal Occult Bld: NEGATIVE

## 2022-01-03 LAB — CBC
HCT: 41.5 % (ref 39.0–52.0)
Hemoglobin: 13.9 g/dL (ref 13.0–17.0)
MCH: 29.5 pg (ref 26.0–34.0)
MCHC: 33.5 g/dL (ref 30.0–36.0)
MCV: 88.1 fL (ref 80.0–100.0)
Platelets: 143 10*3/uL — ABNORMAL LOW (ref 150–400)
RBC: 4.71 MIL/uL (ref 4.22–5.81)
RDW: 15.7 % — ABNORMAL HIGH (ref 11.5–15.5)
WBC: 8.1 10*3/uL (ref 4.0–10.5)
nRBC: 0 % (ref 0.0–0.2)

## 2022-01-03 LAB — COMPREHENSIVE METABOLIC PANEL
ALT: 24 U/L (ref 0–44)
AST: 23 U/L (ref 15–41)
Albumin: 4.1 g/dL (ref 3.5–5.0)
Alkaline Phosphatase: 96 U/L (ref 38–126)
Anion gap: 8 (ref 5–15)
BUN: 42 mg/dL — ABNORMAL HIGH (ref 8–23)
CO2: 24 mmol/L (ref 22–32)
Calcium: 9.1 mg/dL (ref 8.9–10.3)
Chloride: 104 mmol/L (ref 98–111)
Creatinine, Ser: 1.29 mg/dL — ABNORMAL HIGH (ref 0.61–1.24)
GFR, Estimated: 54 mL/min — ABNORMAL LOW (ref >60)
Glucose, Bld: 117 mg/dL — ABNORMAL HIGH (ref 70–99)
Potassium: 3.3 mmol/L — ABNORMAL LOW (ref 3.5–5.1)
Sodium: 136 mmol/L (ref 135–145)
Total Bilirubin: 0.9 mg/dL (ref 0.3–1.2)
Total Protein: 6.8 g/dL (ref 6.5–8.1)

## 2022-01-03 MED ORDER — POTASSIUM CHLORIDE CRYS ER 20 MEQ PO TBCR
40.0000 meq | EXTENDED_RELEASE_TABLET | Freq: Once | ORAL | Status: AC
  Administered 2022-01-03: 40 meq via ORAL
  Filled 2022-01-03: qty 2

## 2022-01-03 NOTE — Discharge Instructions (Addendum)
Hold Eliquis today.  You may resume Eliquis tomorrow if you no longer have any GI bleeding.  If you have recurrent or heavy bleeding, return back to the ER.  Otherwise follow-up with gastroenterology within the week as noted.

## 2022-01-03 NOTE — ED Triage Notes (Addendum)
Pt c/o bloody diarrhea and weakness x 1 week. Denies abdominal pain, fever. + blood thinners (Eliquis)

## 2022-01-03 NOTE — ED Provider Notes (Signed)
Alexander Friedman Provider Note   CSN: [id number] Arrival date & time: 01/03/22  1321     History  Chief Complaint  Patient presents with   Rectal Bleeding   Diarrhea    Alexander Friedman is a 88 y.o. male.  Patient presents ER chief complaint of bloody diarrhea.  He has noticed this for about 2 days.  Last episode was this morning.  He is having about 2-3 episodes of bloody diarrhea at home.  Otherwise denies any abdominal pain denies any fevers denies any vomiting.  His last episode of bloody diarrhea was earlier this morning and he was brought to the ER by his significant other.  Patient continues taking Eliquis for history of atrial fibrillation.      Home Medications Prior to Admission medications   Medication Sig Start Date End Date Taking? Authorizing Provider  aspirin 81 MG chewable tablet Chew by mouth daily.    [provider]  atorvastatin (LIPITOR) 20 MG tablet Take 20 mg by mouth daily.    [provider]  Calcium Carb-Cholecalciferol (CALCIUM-VITAMIN D) 500-200 MG-UNIT tablet Take 1 tablet by mouth daily.    [provider]  cephALEXin (KEFLEX) 500 MG capsule Take 1 capsule (500 mg total) by mouth 2 (two) times daily. 07/29/17   Julianne Rice, MD  chlorthalidone (HYGROTON) 25 MG tablet Take 25 mg by mouth 2 (two) times daily.    [provider]  clotrimazole-betamethasone (LOTRISONE) cream Apply 1 application topically 2 (two) times daily as needed.    [provider]  cyanocobalamin (,VITAMIN B-12,) 1000 MCG/ML injection Inject 1,000 mcg into the muscle See admin instructions. Every 2 months    [provider]  doxycycline (VIBRAMYCIN) 100 MG capsule Take 1 capsule (100 mg total) by mouth 2 (two) times daily. One po bid x 7 days 05/24/20   Drenda Freeze, MD  ferrous sulfate 325 (65 FE) MG tablet Take 325 mg by mouth daily with breakfast.    [provider]  FIBER PO Take  by mouth 2 (two) times daily.    [provider]  Fluticasone-Salmeterol (AIRDUO RESPICLICK 34/28 IN) Inhale into the lungs daily.    [provider]  HYDROmorphone HCl (EXALGO) 12 MG T24A SR tablet Take 12 mg by mouth daily.    [provider]  meloxicam (MOBIC) 7.5 MG tablet Take 7.5 mg by mouth daily.    [provider]  montelukast (SINGULAIR) 10 MG tablet Take 10 mg by mouth at bedtime.    [provider]  nitroGLYCERIN (NITROSTAT) 0.4 MG SL tablet Place 0.4 mg under the tongue every 5 (five) minutes as needed for chest pain.    [provider]  omeprazole (PRILOSEC) 40 MG capsule Take 40 mg by mouth daily.    [provider]  oxymorphone (OPANA) 5 MG tablet Take 5 mg by mouth every 8 (eight) hours as needed for pain.    [provider]  polyethylene glycol (MIRALAX / GLYCOLAX) packet Take 17 g by mouth daily as needed.    [provider]  potassium chloride SA (K-DUR,KLOR-CON) 20 MEQ tablet Take 2 tablets (40 mEq total) by mouth daily. X 3 day 07/29/17   Julianne Rice, MD  pregabalin (LYRICA) 75 MG capsule Take 75 mg by mouth 2 (two) times daily.    [provider]  Probiotic Product (4X PROBIOTIC PO) Take by mouth daily.    [provider]  sertraline (ZOLOFT) 100 MG tablet  Take 100 mg by mouth daily.    [provider]  solifenacin (VESICARE) 10 MG tablet Take by mouth daily.    [provider]  telmisartan (MICARDIS) 80 MG tablet Take 80 mg by mouth daily.    [provider]  traMADol (ULTRAM) 50 MG tablet Take 1 tablet (50 mg total) by mouth every 6 (six) hours as needed. 05/24/20   Drenda Freeze, MD  valACYclovir (VALTREX) 1000 MG tablet Take 1,000 mg by mouth daily as needed.    [provider]  warfarin (COUMADIN) 1 MG tablet Take 4.5 mg by mouth See admin instructions. Take 4.'5mg'$  every Tuesday, Thursday and Saturday    [provider]   warfarin (COUMADIN) 3 MG tablet Take 3 mg by mouth See admin instructions. Take 1 tab every Monday, Wednesday, Friday and Sunday    [provider]  zolpidem (AMBIEN) 5 MG tablet Take 5 mg by mouth at bedtime as needed for sleep.    [provider]      Allergies    Patient has no known allergies.    Review of Systems   Review of Systems  Constitutional:  Negative for fever.  HENT:  Negative for ear pain and sore throat.   Eyes:  Negative for pain.  Respiratory:  Negative for cough.   Cardiovascular:  Negative for chest pain.  Gastrointestinal:  Negative for abdominal pain.  Genitourinary:  Negative for flank pain.  Musculoskeletal:  Negative for back pain.  Skin:  Negative for color change and rash.  Neurological:  Negative for syncope.  All other systems reviewed and are negative.  Physical Exam Updated Vital Signs BP 139/79 (BP Location: Left Arm)   Pulse 63   Temp 98.1 F (36.7 C) (Oral)   Resp 16   SpO2 98%  Physical Exam Constitutional:      Appearance: He is well-developed.  HENT:     Head: Normocephalic.     Nose: Nose normal.  Eyes:     Extraocular Movements: Extraocular movements intact.  Cardiovascular:     Rate and Rhythm: Normal rate.  Pulmonary:     Effort: Pulmonary effort is normal.  Skin:    Coloration: Skin is not jaundiced.  Neurological:     General: No focal deficit present.     Mental Status: He is alert and oriented to person, place, and time. Mental status is at baseline.     Cranial Nerves: No cranial nerve deficit.     Motor: No weakness.    ED Results / Procedures / Treatments   Labs (all labs ordered are listed, but only abnormal results are displayed) Labs Reviewed  COMPREHENSIVE METABOLIC PANEL - Abnormal; Notable for the following components:      Result Value   Potassium 3.3 (*)    Glucose, Bld 117 (*)    BUN 42 (*)    Creatinine, Ser 1.29 (*)    GFR, Estimated 54 (*)    All other components within normal  limits  CBC - Abnormal; Notable for the following components:   RDW 15.7 (*)    Platelets 143 (*)    All other components within normal limits  OCCULT BLOOD X 1 CARD TO LAB, STOOL    EKG None  Radiology No results found.  Procedures Procedures    Medications Ordered in ED Medications  potassium chloride SA (KLOR-CON M) CR tablet 40 mEq (has no administration in time range)    ED Course/ Medical Decision Making/ A&P  Medical Decision Making Amount and/or Complexity of Data Reviewed Labs: ordered.  Risk Prescription drug management.   Chart review shows outpatient visit Dec 18, 2021 for physical debilitation.  Cardiac monitoring shows sinus rhythm.  History obtained from significant other at bedside.  Labs are sent, white count normal hemoglobin normal at 13.9 baseline hemoglobin ranges from around 14-13 looking at his history.  Fecal occult blood test done showing negative, rectal exam showing normal brown color stool.  Patient has no active bleeding at this time hemodynamically stable and normal hemoglobin.  Given his history advised him to hold Eliquis today.  Advised him to restart Eliquis tomorrow if he no longer has any bloody diarrhea.  Advising outpatient follow-up with gastroenterology within the next 3 to 4 days.,  Advising immediate return back to the ER if he has recurrent bleeding fevers pain or any additional concerns.        Final Clinical Impression(s) / ED Diagnoses Final diagnoses:  Gastrointestinal hemorrhage, unspecified gastrointestinal hemorrhage type    Rx / DC Orders ED Discharge Orders          Ordered    Ambulatory referral to Gastroenterology       Comments: Reported GI bleed on Eliquis.  Stable findings during ER visit.   01/03/22 1424              Luna Fuse, MD 01/03/22 1425

## 2022-01-03 NOTE — ED Notes (Signed)
Pt discharged to home. Discharge instructions have been discussed with patient and/or family members. Pt verbally acknowledges understanding d/c instructions, and endorses comprehension to checkout at registration before leaving.  °

## 2022-01-05 ENCOUNTER — Encounter: Payer: Self-pay | Admitting: Emergency Medicine

## 2022-01-07 ENCOUNTER — Encounter: Payer: Self-pay | Admitting: Physician Assistant

## 2022-01-23 ENCOUNTER — Encounter: Payer: Self-pay | Admitting: *Deleted

## 2022-01-31 ENCOUNTER — Ambulatory Visit: Payer: Medicare PPO | Admitting: Physician Assistant

## 2022-03-10 DIAGNOSIS — F32A Depression, unspecified: Secondary | ICD-10-CM | POA: Insufficient documentation

## 2022-03-11 ENCOUNTER — Ambulatory Visit: Payer: Medicare PPO | Admitting: Physician Assistant

## 2022-03-11 ENCOUNTER — Telehealth: Payer: Self-pay | Admitting: Physician Assistant

## 2022-03-11 NOTE — Telephone Encounter (Signed)
Good Morning Alexander Friedman,   Patient called stating he needed to cancel his appointment for this afternoon at 2:00 due to having an accident and living alone and not being able to make it.   Patient stated that he would call back at a later time to reschedule.

## 2022-05-21 ENCOUNTER — Ambulatory Visit: Payer: Medicare PPO | Admitting: Physician Assistant

## 2023-10-03 ENCOUNTER — Emergency Department (HOSPITAL_BASED_OUTPATIENT_CLINIC_OR_DEPARTMENT_OTHER)
Admission: EM | Admit: 2023-10-03 | Discharge: 2023-10-03 | Disposition: A | Discharge status: Home / Self Care | Payer: Medicare PPO | Attending: Emergency Medicine | Admitting: Emergency Medicine

## 2023-10-03 ENCOUNTER — Encounter (HOSPITAL_BASED_OUTPATIENT_CLINIC_OR_DEPARTMENT_OTHER): Payer: Self-pay | Admitting: *Deleted

## 2023-10-03 ENCOUNTER — Other Ambulatory Visit: Payer: Self-pay

## 2023-10-03 DIAGNOSIS — J449 Chronic obstructive pulmonary disease, unspecified: Secondary | ICD-10-CM | POA: Insufficient documentation

## 2023-10-03 DIAGNOSIS — Z7982 Long term (current) use of aspirin: Secondary | ICD-10-CM | POA: Diagnosis not present

## 2023-10-03 DIAGNOSIS — Z7951 Long term (current) use of inhaled steroids: Secondary | ICD-10-CM | POA: Insufficient documentation

## 2023-10-03 DIAGNOSIS — F0390 Unspecified dementia without behavioral disturbance: Secondary | ICD-10-CM | POA: Insufficient documentation

## 2023-10-03 DIAGNOSIS — I2510 Atherosclerotic heart disease of native coronary artery without angina pectoris: Secondary | ICD-10-CM | POA: Insufficient documentation

## 2023-10-03 DIAGNOSIS — N390 Urinary tract infection, site not specified: Secondary | ICD-10-CM

## 2023-10-03 DIAGNOSIS — R1030 Lower abdominal pain, unspecified: Secondary | ICD-10-CM | POA: Diagnosis present

## 2023-10-03 DIAGNOSIS — Z79899 Other long term (current) drug therapy: Secondary | ICD-10-CM | POA: Insufficient documentation

## 2023-10-03 DIAGNOSIS — I10 Essential (primary) hypertension: Secondary | ICD-10-CM | POA: Diagnosis not present

## 2023-10-03 DIAGNOSIS — Z8546 Personal history of malignant neoplasm of prostate: Secondary | ICD-10-CM | POA: Diagnosis not present

## 2023-10-03 DIAGNOSIS — I4891 Unspecified atrial fibrillation: Secondary | ICD-10-CM | POA: Insufficient documentation

## 2023-10-03 DIAGNOSIS — Z7901 Long term (current) use of anticoagulants: Secondary | ICD-10-CM | POA: Diagnosis not present

## 2023-10-03 DIAGNOSIS — D72829 Elevated white blood cell count, unspecified: Secondary | ICD-10-CM | POA: Diagnosis not present

## 2023-10-03 LAB — URINALYSIS, ROUTINE W REFLEX MICROSCOPIC
Bilirubin Urine: NEGATIVE
Glucose, UA: NEGATIVE mg/dL
Ketones, ur: NEGATIVE mg/dL
Nitrite: POSITIVE — AB
Protein, ur: 30 mg/dL — AB
Specific Gravity, Urine: 1.02 (ref 1.005–1.030)
pH: 6.5 (ref 5.0–8.0)

## 2023-10-03 LAB — CBC
HCT: 37.7 % — ABNORMAL LOW (ref 39.0–52.0)
Hemoglobin: 12.1 g/dL — ABNORMAL LOW (ref 13.0–17.0)
MCH: 25.7 pg — ABNORMAL LOW (ref 26.0–34.0)
MCHC: 32.1 g/dL (ref 30.0–36.0)
MCV: 80 fL (ref 80.0–100.0)
Platelets: 217 10*3/uL (ref 150–400)
RBC: 4.71 MIL/uL (ref 4.22–5.81)
RDW: 17.6 % — ABNORMAL HIGH (ref 11.5–15.5)
WBC: 8.9 10*3/uL (ref 4.0–10.5)
nRBC: 0 % (ref 0.0–0.2)

## 2023-10-03 LAB — BASIC METABOLIC PANEL
Anion gap: 12 (ref 5–15)
BUN: 26 mg/dL — ABNORMAL HIGH (ref 8–23)
CO2: 25 mmol/L (ref 22–32)
Calcium: 9.2 mg/dL (ref 8.9–10.3)
Chloride: 99 mmol/L (ref 98–111)
Creatinine, Ser: 1.01 mg/dL (ref 0.61–1.24)
GFR, Estimated: >60 mL/min (ref >60)
Glucose, Bld: 113 mg/dL — ABNORMAL HIGH (ref 70–99)
Potassium: 3.2 mmol/L — ABNORMAL LOW (ref 3.5–5.1)
Sodium: 136 mmol/L (ref 135–145)

## 2023-10-03 LAB — URINALYSIS, MICROSCOPIC (REFLEX)

## 2023-10-03 MED ORDER — CIPROFLOXACIN HCL 500 MG PO TABS: 500.0000 mg | ORAL_TABLET | Freq: Two times a day (BID) | ORAL | 0 refills | Status: AC

## 2023-10-03 MED ORDER — POTASSIUM CHLORIDE CRYS ER 20 MEQ PO TBCR
40.0000 meq | EXTENDED_RELEASE_TABLET | Freq: Once | ORAL | Status: AC
  Administered 2023-10-03: 40 meq via ORAL
  Filled 2023-10-03: qty 2

## 2023-10-03 MED ORDER — CIPROFLOXACIN HCL 500 MG PO TABS
500.0000 mg | ORAL_TABLET | Freq: Once | ORAL | Status: AC
  Administered 2023-10-03: 500 mg via ORAL
  Filled 2023-10-03: qty 1

## 2023-10-03 NOTE — ED Triage Notes (Signed)
 Pt with hx of recurrent UTI, he has been treated for 4 UTI since November.  Pt has a suprapubic cath which was last changed mid February.  Pt reports that he has been having bladder spasms.

## 2023-10-03 NOTE — ED Notes (Signed)
 EDP at bedside

## 2023-10-03 NOTE — Discharge Instructions (Addendum)
 You were seen in emergency room today for urinary tract infection.  I have sent antibiotic to pharmacy please take as prescribed.  I sent your urine for culture last will call if this changes treatment.  Please follow-up with urology and primary care.  Return to emergency room with any new or worsening symptoms.  You can continue taking Azo as needed however it does not treat urinary tract infection - it may change the color of your urine.

## 2023-10-03 NOTE — ED Provider Notes (Signed)
 [place] [facility] Provider Note   CSN: [id number] Arrival date & time: 10/03/23  1348     History  Chief Complaint  Patient presents with   Urinary Tract Infection    Alexander Friedman is a 89 y.o. male with past medical history of coronary artery disease, atrial fibrillation, dementia, BPH, prostate cancer, COPD, hypertension, hyperlipidemia reporting to the emergency room with complaint of bladder spasm, suprapubic pain, foul smell and discolored urine. Reports trying Azos for symptoms, which did not help.  He reports he has had recurrent urinary tract infection.  He is concerned he has urinary tract infection right now. Denies weakness, chest pain, shortness of breath, fevers, chills, NVD.   Urinary Tract Infection Associated symptoms: abdominal pain        Home Medications Prior to Admission medications   Medication Sig Start Date End Date Taking? Authorizing Provider  albuterol (PROVENTIL) (2.5 MG/3ML) 0.083% nebulizer solution USE 1 VIAL VIA NEBULIZER EVERY 6 HOURS AS NEEDED 01/24/21   [provider]  Albuterol Sulfate (PROAIR RESPICLICK) 108 (90 Base) MCG/ACT AEPB INHALE 2 PUFFS BY MOUTH FOUR TIMES DAILY AS NEEDED FOR SHORTNESS OF BREATH 02/07/21   [provider]  apixaban (ELIQUIS) 5 MG TABS tablet TAKE 1 TABLET(5 MG) BY MOUTH TWICE DAILY FOR ATRIAL FIBRILLATION 03/22/18   [provider]  aspirin 81 MG chewable tablet Chew by mouth daily.    [provider]  atorvastatin (LIPITOR) 20 MG tablet Take 20 mg by mouth daily.    [provider]  buPROPion (WELLBUTRIN XL) 150 MG 24 hr tablet TAKE 1 TABLET(150 MG) BY MOUTH EVERY MORNING 10/14/21   [provider]  Calcium Carb-Cholecalciferol (CALCIUM-VITAMIN D) 500-200 MG-UNIT tablet Take 1 tablet by mouth daily.    [provider]  cephALEXin (KEFLEX) 500 MG capsule Take 1 capsule (500 mg total) by mouth 2 (two) times daily.  07/29/17   Loren Racer, MD  chlorthalidone (HYGROTON) 25 MG tablet Take 25 mg by mouth 2 (two) times daily.    [provider]  clotrimazole-betamethasone (LOTRISONE) cream Apply 1 application topically 2 (two) times daily as needed.    [provider]  cyanocobalamin (,VITAMIN B-12,) 1000 MCG/ML injection Inject 1,000 mcg into the muscle See admin instructions. Every 2 months    [provider]  doxycycline (VIBRAMYCIN) 100 MG capsule Take 1 capsule (100 mg total) by mouth 2 (two) times daily. One po bid x 7 days 05/24/20   Charlynne Pander, MD  ferrous sulfate 325 (65 FE) MG tablet Take 325 mg by mouth daily with breakfast.    [provider]  FIBER PO Take by mouth 2 (two) times daily.    [provider]  Fluticasone-Salmeterol (AIRDUO RESPICLICK 55/14 IN) Inhale into the lungs daily.    [provider]  furosemide (LASIX) 20 MG tablet Take 20 mg by mouth daily. 10/28/21   [provider]  HYDROmorphone HCl (EXALGO) 12 MG T24A SR tablet Take 12 mg by mouth daily.    [provider]  meloxicam (MOBIC) 7.5 MG tablet Take 7.5 mg by mouth daily.    [provider]  montelukast (SINGULAIR) 10 MG tablet Take 10 mg by mouth at bedtime.    [provider]  nitroGLYCERIN (NITROSTAT) 0.4 MG SL tablet Place 0.4 mg under the tongue every 5 (five) minutes as needed for chest pain.    [provider]  omeprazole (PRILOSEC) 40 MG capsule Take 40  mg by mouth daily.    [provider]  oxymorphone (OPANA) 5 MG tablet Take 5 mg by mouth every 8 (eight) hours as needed for pain.    [provider]  polyethylene glycol (MIRALAX / GLYCOLAX) packet Take 17 g by mouth daily as needed.    [provider]  potassium chloride SA (K-DUR,KLOR-CON) 20 MEQ tablet Take 2 tablets (40 mEq total) by mouth daily. X 3 day 07/29/17   Loren Racer, MD  pregabalin (LYRICA) 75 MG capsule Take 75 mg by  mouth 2 (two) times daily.    [provider]  Probiotic Product (4X PROBIOTIC PO) Take by mouth daily.    [provider]  sertraline (ZOLOFT) 100 MG tablet Take 100 mg by mouth daily.    [provider]  solifenacin (VESICARE) 10 MG tablet Take by mouth daily.    [provider]  telmisartan (MICARDIS) 80 MG tablet Take 80 mg by mouth daily.    [provider]  traMADol (ULTRAM) 50 MG tablet Take 1 tablet (50 mg total) by mouth every 6 (six) hours as needed. 05/24/20   Charlynne Pander, MD  valACYclovir (VALTREX) 1000 MG tablet Take 1,000 mg by mouth daily as needed.    [provider]  warfarin (COUMADIN) 1 MG tablet Take 4.5 mg by mouth See admin instructions. Take 4.5mg  every Tuesday, Thursday and Saturday    [provider]  warfarin (COUMADIN) 3 MG tablet Take 3 mg by mouth See admin instructions. Take 1 tab every Monday, Wednesday, Friday and Sunday    [provider]  zolpidem (AMBIEN) 5 MG tablet Take 5 mg by mouth at bedtime as needed for sleep.    [provider]      Allergies    Patient has no known allergies.    Review of Systems   Review of Systems  Gastrointestinal:  Positive for abdominal pain.    Physical Exam Updated Vital Signs BP (!) 149/91 (BP Location: Right Arm)   Pulse 89   Temp 98 F (36.7 C) (Oral)   Resp 18   SpO2 96%  Physical Exam Vitals and nursing note reviewed.  Constitutional:      General: He is not in acute distress.    Appearance: He is not toxic-appearing.  HENT:     Head: Normocephalic and atraumatic.  Eyes:     General: No scleral icterus.    Conjunctiva/sclera: Conjunctivae normal.  Cardiovascular:     Rate and Rhythm: Normal rate and regular rhythm.     Pulses: Normal pulses.     Heart sounds: Normal heart sounds.  Pulmonary:     Effort: Pulmonary effort is normal. No respiratory distress.     Breath sounds: Normal breath sounds.  Abdominal:      General: Abdomen is flat. Bowel sounds are normal. There is no distension.     Palpations: Abdomen is soft. There is no mass.     Tenderness: There is no abdominal tenderness. There is no right CVA tenderness or left CVA tenderness.     Comments: Mild suprapubic pain, no pain with palpation.   Musculoskeletal:     Right lower leg: No edema.     Left lower leg: No edema.  Skin:    General: Skin is warm and dry.     Findings: No lesion.  Neurological:     General: No focal deficit present.     Mental Status: He is alert and oriented to person,  place, and time. Mental status is at baseline.     ED Results / Procedures / Treatments   Labs (all labs ordered are listed, but only abnormal results are displayed) Labs Reviewed  URINALYSIS, ROUTINE W REFLEX MICROSCOPIC - Abnormal; Notable for the following components:      Result Value   APPearance CLOUDY (*)    Hgb urine dipstick TRACE (*)    Protein, ur 30 (*)    Nitrite POSITIVE (*)    Leukocytes,Ua SMALL (*)    All other components within normal limits  BASIC METABOLIC PANEL - Abnormal; Notable for the following components:   Potassium 3.2 (*)    Glucose, Bld 113 (*)    BUN 26 (*)    All other components within normal limits  CBC - Abnormal; Notable for the following components:   Hemoglobin 12.1 (*)    HCT 37.7 (*)    MCH 25.7 (*)    RDW 17.6 (*)    All other components within normal limits  URINALYSIS, MICROSCOPIC (REFLEX) - Abnormal; Notable for the following components:   Bacteria, UA MANY (*)    All other components within normal limits  URINE CULTURE    EKG None  Radiology No results found.  Procedures Procedures    Medications Ordered in ED Medications - No data to display  ED Course/ Medical Decision Making/ A&P                                 Medical Decision Making Amount and/or Complexity of Data Reviewed Labs: ordered.  Risk Prescription drug management.   DAYSHAUN WHOBREY 89 y.o. presented  today for suprapubic pain. Working Ddx: includes, but not limited to, gastroenteritis, colitis, appendicitis, pancreatitis, nephrolithiasis, UTI, pyelonephritis, dehydration, electrolyte abnormalities   R/o DDx: These are considered less likely than current impression due to history of present illness, physical exam, labs/imaging findings.  Review of prior external notes: Reviewed urine culture from 2 months ago at [facility] in which patient grew Pseudomonas susceptible to ciprofloxacin  Pmhx: CAD, COPD, dementia, BPH, prostate cancer   Unique Tests and My Interpretation:  CBC: No leukocytosis.  Hemoglobin stable at 12.1 BMP: Potassium is 3.2 will supplement with oral potassium no AKI normal GFR UA: positive for nitrites leukocytes  Will send UA for culture.   Imaging: No focal abdominal tenderness suggesting the need for imaging at this time.   Problem List / ED Course / Critical interventions / Medication management  4 days of UTI like symptoms. No sign of systemic illness - no generalized weakness, AMS or fatigue. No leukocytosis. Hemodynamically stable and well appearing. Has appropriate follow up with urology. Given first does of antibiotic here for UTI. Just had foley changes, do not feel like it needs replaced again at this time.  I ordered medication including Potassium for hypokalemia Reevaluation of the patient after these medicines showed that the patient stayed the same Patients vitals assessed. Upon arrival patient is hemodynamically stable.  I have reviewed the patients home medicines and have made adjustments as needed   Consult: None   Plan:  Will treat with Cipro because he has culture sensitive to Cipro.  He will follow-up with urology as soon as possible.  He will return with new or worsening symptoms.         Final Clinical Impression(s) / ED Diagnoses Final diagnoses:  Lower urinary tract infectious disease    Rx /  DC Orders ED Discharge Orders           Ordered    ciprofloxacin (CIPRO) 500 MG tablet  Every 12 hours        10/03/23 1653              Teyah Rossy, Horald Chestnut, PA-C 10/03/23 1709    Alvira Monday, MD 10/04/23 [phone]

## 2023-10-05 LAB — URINE CULTURE: Culture: >=100000 — AB

## 2023-10-06 ENCOUNTER — Telehealth (HOSPITAL_BASED_OUTPATIENT_CLINIC_OR_DEPARTMENT_OTHER): Payer: Self-pay

## 2023-10-06 NOTE — Telephone Encounter (Signed)
 Post ED Visit - Positive Culture Follow-up: Successful Patient Follow-Up  Culture assessed and recommendations reviewed by:  [x]  Ivery Quale, Pharm.D. []  Celedonio Miyamoto, Pharm.D., BCPS AQ-ID []  Garvin Fila, Pharm.D., BCPS []  Georgina Pillion, Pharm.D., BCPS []  [place], [place].D., BCPS, AAHIVP []  Estella Husk, Pharm.D., BCPS, AAHIVP []  Lysle Pearl, PharmD, BCPS []  Phillips Climes, PharmD, BCPS []  Agapito Games, PharmD, BCPS []  Verlan Friends, PharmD  Positive urine culture  []  Patient discharged without antimicrobial prescription and treatment is now indicated [x]  Organism is resistant to prescribed ED discharge antimicrobial []  Patient with positive blood cultures  Changes discussed with ED provider: Evlyn Kanner, PA New antibiotic prescription Augmentin 500 mg po BID x 7 days Called to [facility] in [place]  Contacted patient, date 10/06/23, time 10:50 am   Alexander Friedman 10/06/2023, 10:56 AM
